# Patient Record
Sex: Male | Born: 1965 | Race: Black or African American | Hispanic: No | Smoking: Former smoker
Health system: Southern US, Community
[De-identification: ages and names within clinical notes are randomized; demographics above are authoritative.]

---

## 2003-02-20 ENCOUNTER — Encounter: Payer: Self-pay | Admitting: Emergency Medicine

## 2003-02-20 ENCOUNTER — Emergency Department (HOSPITAL_COMMUNITY): Admission: EM | Admit: 2003-02-20 | Discharge: 2003-02-20 | Payer: Self-pay | Admitting: Emergency Medicine

## 2006-01-11 ENCOUNTER — Emergency Department (HOSPITAL_COMMUNITY): Admission: EM | Admit: 2006-01-11 | Discharge: 2006-01-11 | Payer: Self-pay | Admitting: Family Medicine

## 2008-04-24 ENCOUNTER — Emergency Department (HOSPITAL_COMMUNITY): Admission: EM | Admit: 2008-04-24 | Discharge: 2008-04-24 | Payer: Self-pay | Admitting: Emergency Medicine

## 2011-08-25 LAB — POCT CARDIAC MARKERS
CKMB, poc: 1.9
Myoglobin, poc: 116
Operator id: 272551
Troponin i, poc: <0.05

## 2011-08-25 LAB — DIFFERENTIAL
Basophils Absolute: 0
Basophils Relative: 0
Eosinophils Absolute: 0
Eosinophils Relative: 0
Lymphocytes Relative: 14
Lymphs Abs: 1.2
Monocytes Absolute: 0.6
Monocytes Relative: 7
Neutro Abs: 6.5
Neutrophils Relative %: 78 — ABNORMAL HIGH

## 2011-08-25 LAB — CBC
HCT: 45.6
Hemoglobin: 15.4
MCHC: 33.9
MCV: 98.1
Platelets: 273
RBC: 4.65
RDW: 12.8
WBC: 8.4

## 2011-08-25 LAB — COMPREHENSIVE METABOLIC PANEL
ALT: 16
CO2: 25
Calcium: 9.7
GFR calc non Af Amer: >60
Glucose, Bld: 83
Sodium: 137

## 2011-08-25 LAB — RAPID URINE DRUG SCREEN, HOSP PERFORMED: Cocaine: NOT DETECTED

## 2011-08-25 LAB — POCT I-STAT, CHEM 8
Creatinine, Ser: 1.1
Glucose, Bld: 82
Hemoglobin: 17
TCO2: 25

## 2011-08-25 LAB — MAGNESIUM: Magnesium: 2.2

## 2018-01-21 ENCOUNTER — Other Ambulatory Visit: Payer: Self-pay

## 2018-01-21 ENCOUNTER — Emergency Department (HOSPITAL_COMMUNITY): Payer: BC Managed Care – PPO

## 2018-01-21 ENCOUNTER — Encounter (HOSPITAL_COMMUNITY): Payer: Self-pay

## 2018-01-21 ENCOUNTER — Encounter (HOSPITAL_COMMUNITY)
Admission: EM | Disposition: A | Discharge status: Home / Self Care | Payer: Self-pay | Source: Home / Self Care | Attending: Emergency Medicine

## 2018-01-21 ENCOUNTER — Observation Stay (HOSPITAL_COMMUNITY)
Admission: EM | Admit: 2018-01-21 | Discharge: 2018-01-22 | Disposition: A | Discharge status: Home / Self Care | Payer: BC Managed Care – PPO | Attending: General Surgery | Admitting: General Surgery

## 2018-01-21 ENCOUNTER — Emergency Department (HOSPITAL_COMMUNITY): Payer: BC Managed Care – PPO | Admitting: Anesthesiology

## 2018-01-21 DIAGNOSIS — K353 Acute appendicitis with localized peritonitis, without perforation or gangrene: Secondary | ICD-10-CM | POA: Diagnosis present

## 2018-01-21 DIAGNOSIS — I700 Atherosclerosis of aorta: Secondary | ICD-10-CM | POA: Insufficient documentation

## 2018-01-21 DIAGNOSIS — K3533 Acute appendicitis with perforation and localized peritonitis, with abscess: Secondary | ICD-10-CM | POA: Diagnosis not present

## 2018-01-21 DIAGNOSIS — I10 Essential (primary) hypertension: Secondary | ICD-10-CM | POA: Diagnosis not present

## 2018-01-21 DIAGNOSIS — Z79899 Other long term (current) drug therapy: Secondary | ICD-10-CM | POA: Diagnosis not present

## 2018-01-21 DIAGNOSIS — F17210 Nicotine dependence, cigarettes, uncomplicated: Secondary | ICD-10-CM | POA: Diagnosis not present

## 2018-01-21 HISTORY — PX: LAPAROSCOPIC APPENDECTOMY: SHX408

## 2018-01-21 LAB — COMPREHENSIVE METABOLIC PANEL
ALK PHOS: 60 U/L (ref 38–126)
ALT: 20 U/L (ref 17–63)
AST: 35 U/L (ref 15–41)
Albumin: 3.7 g/dL (ref 3.5–5.0)
Anion gap: 19 — ABNORMAL HIGH (ref 5–15)
BILIRUBIN TOTAL: 1.1 mg/dL (ref 0.3–1.2)
BUN: 11 mg/dL (ref 6–20)
CALCIUM: 8.5 mg/dL — AB (ref 8.9–10.3)
CO2: 22 mmol/L (ref 22–32)
Chloride: 94 mmol/L — ABNORMAL LOW (ref 101–111)
Creatinine, Ser: 1.14 mg/dL (ref 0.61–1.24)
Glucose, Bld: 148 mg/dL — ABNORMAL HIGH (ref 65–99)
Potassium: 3.8 mmol/L (ref 3.5–5.1)
Sodium: 135 mmol/L (ref 135–145)
TOTAL PROTEIN: 6.5 g/dL (ref 6.5–8.1)

## 2018-01-21 LAB — CBC
HCT: 46.3 % (ref 39.0–52.0)
Hemoglobin: 15.5 g/dL (ref 13.0–17.0)
MCH: 33.5 pg (ref 26.0–34.0)
MCHC: 33.5 g/dL (ref 30.0–36.0)
MCV: 100 fL (ref 78.0–100.0)
Platelets: 234 10*3/uL (ref 150–400)
RBC: 4.63 MIL/uL (ref 4.22–5.81)
RDW: 11.7 % (ref 11.5–15.5)
WBC: 14.2 10*3/uL — ABNORMAL HIGH (ref 4.0–10.5)

## 2018-01-21 LAB — URINALYSIS, ROUTINE W REFLEX MICROSCOPIC
Bacteria, UA: NONE SEEN
Bilirubin Urine: NEGATIVE
GLUCOSE, UA: NEGATIVE mg/dL
HGB URINE DIPSTICK: NEGATIVE
Ketones, ur: NEGATIVE mg/dL
Leukocytes, UA: NEGATIVE
NITRITE: NEGATIVE
Protein, ur: 30 mg/dL — AB
Specific Gravity, Urine: 1.029 (ref 1.005–1.030)
Squamous Epithelial / LPF: NONE SEEN
pH: 5 (ref 5.0–8.0)

## 2018-01-21 LAB — LIPASE, BLOOD: Lipase: 162 U/L — ABNORMAL HIGH (ref 11–51)

## 2018-01-21 SURGERY — APPENDECTOMY, LAPAROSCOPIC
Anesthesia: General | Site: Abdomen

## 2018-01-21 MED ORDER — BUPIVACAINE-EPINEPHRINE 0.25% -1:200000 IJ SOLN
INTRAMUSCULAR | Status: DC | PRN
  Administered 2018-01-21: 8 mL

## 2018-01-21 MED ORDER — ALBUTEROL SULFATE (2.5 MG/3ML) 0.083% IN NEBU
INHALATION_SOLUTION | RESPIRATORY_TRACT | Status: AC
  Filled 2018-01-21: qty 3

## 2018-01-21 MED ORDER — MORPHINE SULFATE (PF) 4 MG/ML IV SOLN
4.0000 mg | Freq: Once | INTRAVENOUS | Status: AC
  Administered 2018-01-21: 4 mg via INTRAVENOUS
  Filled 2018-01-21: qty 1

## 2018-01-21 MED ORDER — LORAZEPAM 1 MG PO TABS: 0.0000 mg | ORAL_TABLET | Freq: Two times a day (BID) | ORAL | Status: DC

## 2018-01-21 MED ORDER — THIAMINE HCL 100 MG/ML IJ SOLN: 100.0000 mg | Freq: Every day | INTRAMUSCULAR | Status: DC

## 2018-01-21 MED ORDER — ENOXAPARIN SODIUM 40 MG/0.4ML ~~LOC~~ SOLN: 40.0000 mg | SUBCUTANEOUS | Status: DC

## 2018-01-21 MED ORDER — ONDANSETRON HCL 4 MG/2ML IJ SOLN
INTRAMUSCULAR | Status: DC | PRN
  Administered 2018-01-21: 4 mg via INTRAVENOUS

## 2018-01-21 MED ORDER — IOPAMIDOL (ISOVUE-300) INJECTION 61%
INTRAVENOUS | Status: AC
  Administered 2018-01-21: 100 mL
  Filled 2018-01-21: qty 100

## 2018-01-21 MED ORDER — SODIUM CHLORIDE 0.9 % IV SOLN
2.0000 g | Freq: Once | INTRAVENOUS | Status: AC
  Administered 2018-01-21: 2 g via INTRAVENOUS
  Filled 2018-01-21: qty 20

## 2018-01-21 MED ORDER — OXYCODONE HCL 5 MG/5ML PO SOLN: 5.0000 mg | Freq: Once | ORAL | Status: DC | PRN

## 2018-01-21 MED ORDER — METRONIDAZOLE IN NACL 5-0.79 MG/ML-% IV SOLN
500.0000 mg | Freq: Once | INTRAVENOUS | Status: AC
  Administered 2018-01-21: 500 mg via INTRAVENOUS
  Filled 2018-01-21: qty 100

## 2018-01-21 MED ORDER — SODIUM CHLORIDE 0.9 % IR SOLN
Status: DC | PRN
  Administered 2018-01-21: 1000 mL

## 2018-01-21 MED ORDER — EPINEPHRINE PF 1 MG/ML IJ SOLN
INTRAMUSCULAR | Status: AC
  Filled 2018-01-21: qty 1

## 2018-01-21 MED ORDER — HYDROMORPHONE HCL 1 MG/ML IJ SOLN: 0.2500 mg | INTRAMUSCULAR | Status: DC | PRN

## 2018-01-21 MED ORDER — ALBUTEROL SULFATE (2.5 MG/3ML) 0.083% IN NEBU
2.5000 mg | INHALATION_SOLUTION | Freq: Four times a day (QID) | RESPIRATORY_TRACT | Status: DC | PRN
  Administered 2018-01-21: 2.5 mg via RESPIRATORY_TRACT

## 2018-01-21 MED ORDER — OXYCODONE HCL 5 MG PO TABS: 5.0000 mg | ORAL_TABLET | ORAL | Status: DC | PRN

## 2018-01-21 MED ORDER — ADULT MULTIVITAMIN W/MINERALS CH
1.0000 | ORAL_TABLET | Freq: Every day | ORAL | Status: DC
  Administered 2018-01-22: 1 via ORAL
  Filled 2018-01-21: qty 1

## 2018-01-21 MED ORDER — DIPHENHYDRAMINE HCL 50 MG/ML IJ SOLN: 25.0000 mg | Freq: Four times a day (QID) | INTRAMUSCULAR | Status: DC | PRN

## 2018-01-21 MED ORDER — ONDANSETRON 4 MG PO TBDP: 4.0000 mg | ORAL_TABLET | Freq: Four times a day (QID) | ORAL | Status: DC | PRN

## 2018-01-21 MED ORDER — LORAZEPAM 2 MG/ML IJ SOLN: 1.0000 mg | Freq: Four times a day (QID) | INTRAMUSCULAR | Status: DC | PRN

## 2018-01-21 MED ORDER — METOPROLOL TARTRATE 5 MG/5ML IV SOLN: 5.0000 mg | Freq: Four times a day (QID) | INTRAVENOUS | Status: DC | PRN

## 2018-01-21 MED ORDER — ONDANSETRON HCL 4 MG/2ML IJ SOLN: 4.0000 mg | Freq: Four times a day (QID) | INTRAMUSCULAR | Status: DC | PRN

## 2018-01-21 MED ORDER — NICOTINE 21 MG/24HR TD PT24
21.0000 mg | MEDICATED_PATCH | Freq: Once | TRANSDERMAL | Status: DC
  Administered 2018-01-21: 21 mg via TRANSDERMAL
  Filled 2018-01-21: qty 1

## 2018-01-21 MED ORDER — NICOTINE 14 MG/24HR TD PT24: 14.0000 mg | MEDICATED_PATCH | Freq: Every day | TRANSDERMAL | Status: DC

## 2018-01-21 MED ORDER — SODIUM CHLORIDE 0.9 % IV BOLUS (SEPSIS)
1000.0000 mL | Freq: Once | INTRAVENOUS | Status: AC
  Administered 2018-01-21: 1000 mL via INTRAVENOUS

## 2018-01-21 MED ORDER — SUGAMMADEX SODIUM 200 MG/2ML IV SOLN
INTRAVENOUS | Status: DC | PRN
  Administered 2018-01-21: 175 mg via INTRAVENOUS

## 2018-01-21 MED ORDER — SODIUM CHLORIDE 0.9 % IV SOLN
2.0000 g | INTRAVENOUS | Status: DC
  Filled 2018-01-21: qty 20

## 2018-01-21 MED ORDER — LORAZEPAM 1 MG PO TABS: 1.0000 mg | ORAL_TABLET | Freq: Four times a day (QID) | ORAL | Status: DC | PRN

## 2018-01-21 MED ORDER — METRONIDAZOLE IN NACL 5-0.79 MG/ML-% IV SOLN
500.0000 mg | Freq: Three times a day (TID) | INTRAVENOUS | Status: DC
  Administered 2018-01-22 (×2): 500 mg via INTRAVENOUS
  Filled 2018-01-21 (×3): qty 100

## 2018-01-21 MED ORDER — POLYETHYLENE GLYCOL 3350 17 G PO PACK: 17.0000 g | PACK | Freq: Every day | ORAL | Status: DC | PRN

## 2018-01-21 MED ORDER — LIDOCAINE HCL (CARDIAC) 20 MG/ML IV SOLN
INTRAVENOUS | Status: DC | PRN
  Administered 2018-01-21: 60 mg via INTRAVENOUS

## 2018-01-21 MED ORDER — PROPOFOL 10 MG/ML IV BOLUS
INTRAVENOUS | Status: DC | PRN
  Administered 2018-01-21: 200 mg via INTRAVENOUS

## 2018-01-21 MED ORDER — BUPIVACAINE HCL (PF) 0.25 % IJ SOLN
INTRAMUSCULAR | Status: AC
  Filled 2018-01-21: qty 30

## 2018-01-21 MED ORDER — MIDAZOLAM HCL 2 MG/2ML IJ SOLN
INTRAMUSCULAR | Status: AC
  Filled 2018-01-21: qty 2

## 2018-01-21 MED ORDER — VITAMIN B-1 100 MG PO TABS
100.0000 mg | ORAL_TABLET | Freq: Every day | ORAL | Status: DC
  Administered 2018-01-22: 100 mg via ORAL
  Filled 2018-01-21: qty 1

## 2018-01-21 MED ORDER — METOPROLOL TARTRATE 5 MG/5ML IV SOLN
INTRAVENOUS | Status: DC | PRN
  Administered 2018-01-21: 1 mg via INTRAVENOUS
  Administered 2018-01-21 (×2): .5 mg via INTRAVENOUS

## 2018-01-21 MED ORDER — OXYCODONE HCL 5 MG PO TABS: 5.0000 mg | ORAL_TABLET | Freq: Once | ORAL | Status: DC | PRN

## 2018-01-21 MED ORDER — 0.9 % SODIUM CHLORIDE (POUR BTL) OPTIME
TOPICAL | Status: DC | PRN
  Administered 2018-01-21: 1000 mL

## 2018-01-21 MED ORDER — AMLODIPINE BESYLATE 5 MG PO TABS
5.0000 mg | ORAL_TABLET | Freq: Every day | ORAL | Status: DC
  Administered 2018-01-22: 5 mg via ORAL
  Filled 2018-01-21: qty 1

## 2018-01-21 MED ORDER — LORAZEPAM 1 MG PO TABS: 0.0000 mg | ORAL_TABLET | Freq: Four times a day (QID) | ORAL | Status: DC

## 2018-01-21 MED ORDER — MORPHINE SULFATE (PF) 4 MG/ML IV SOLN
2.0000 mg | Freq: Once | INTRAVENOUS | Status: AC
  Administered 2018-01-21: 2 mg via INTRAVENOUS
  Filled 2018-01-21: qty 1

## 2018-01-21 MED ORDER — FENTANYL CITRATE (PF) 250 MCG/5ML IJ SOLN
INTRAMUSCULAR | Status: AC
  Filled 2018-01-21: qty 5

## 2018-01-21 MED ORDER — ACETAMINOPHEN 325 MG PO TABS
650.0000 mg | ORAL_TABLET | Freq: Four times a day (QID) | ORAL | Status: DC | PRN
  Administered 2018-01-22: 650 mg via ORAL
  Filled 2018-01-21: qty 2

## 2018-01-21 MED ORDER — DEXAMETHASONE SODIUM PHOSPHATE 10 MG/ML IJ SOLN
INTRAMUSCULAR | Status: DC | PRN
  Administered 2018-01-21: 10 mg via INTRAVENOUS

## 2018-01-21 MED ORDER — FENTANYL CITRATE (PF) 250 MCG/5ML IJ SOLN
INTRAMUSCULAR | Status: AC
Start: 2018-01-21 — End: ?
  Filled 2018-01-21: qty 5

## 2018-01-21 MED ORDER — DIPHENHYDRAMINE HCL 25 MG PO CAPS
25.0000 mg | ORAL_CAPSULE | Freq: Four times a day (QID) | ORAL | Status: DC | PRN
Start: 2018-01-21 — End: 2018-01-22

## 2018-01-21 MED ORDER — ACETAMINOPHEN 650 MG RE SUPP: 650.0000 mg | Freq: Four times a day (QID) | RECTAL | Status: DC | PRN

## 2018-01-21 MED ORDER — ROCURONIUM BROMIDE 100 MG/10ML IV SOLN
INTRAVENOUS | Status: DC | PRN
  Administered 2018-01-21: 50 mg via INTRAVENOUS

## 2018-01-21 MED ORDER — MIDAZOLAM HCL 5 MG/5ML IJ SOLN
INTRAMUSCULAR | Status: DC | PRN
  Administered 2018-01-21 (×2): 2 mg via INTRAVENOUS

## 2018-01-21 MED ORDER — SODIUM CHLORIDE 0.9 % IV SOLN
INTRAVENOUS | Status: DC
  Administered 2018-01-22: 07:00:00 via INTRAVENOUS

## 2018-01-21 MED ORDER — LACTATED RINGERS IV SOLN
INTRAVENOUS | Status: DC | PRN
  Administered 2018-01-21 (×2): via INTRAVENOUS

## 2018-01-21 MED ORDER — FOLIC ACID 1 MG PO TABS
1.0000 mg | ORAL_TABLET | Freq: Every day | ORAL | Status: DC
  Administered 2018-01-22: 1 mg via ORAL
  Filled 2018-01-21: qty 1

## 2018-01-21 MED ORDER — FENTANYL CITRATE (PF) 100 MCG/2ML IJ SOLN
INTRAMUSCULAR | Status: DC | PRN
  Administered 2018-01-21 (×5): 50 ug via INTRAVENOUS

## 2018-01-21 MED ORDER — HYDROMORPHONE HCL 1 MG/ML IJ SOLN: 1.0000 mg | INTRAMUSCULAR | Status: DC | PRN

## 2018-01-21 MED ORDER — SUCCINYLCHOLINE CHLORIDE 20 MG/ML IJ SOLN
INTRAMUSCULAR | Status: DC | PRN
  Administered 2018-01-21: 120 mg via INTRAVENOUS

## 2018-01-21 SURGICAL SUPPLY — 42 items
APPLIER CLIP ROT 10 11.4 M/L (STAPLE)
BENZOIN TINCTURE PRP APPL 2/3 (GAUZE/BANDAGES/DRESSINGS) ×3 IMPLANT
BLADE CLIPPER SURG (BLADE) ×3 IMPLANT
CANISTER SUCT 3000ML PPV (MISCELLANEOUS) ×3 IMPLANT
CHLORAPREP W/TINT 26ML (MISCELLANEOUS) ×3 IMPLANT
CLIP APPLIE ROT 10 11.4 M/L (STAPLE) IMPLANT
CLOSURE WOUND 1/2 X4 (GAUZE/BANDAGES/DRESSINGS) ×1
COVER SURGICAL LIGHT HANDLE (MISCELLANEOUS) ×3 IMPLANT
CUTTER FLEX LINEAR 45M (STAPLE) ×3 IMPLANT
DRSG TEGADERM 2-3/8X2-3/4 SM (GAUZE/BANDAGES/DRESSINGS) ×3 IMPLANT
DRSG TEGADERM 4X4.75 (GAUZE/BANDAGES/DRESSINGS) ×3 IMPLANT
ELECT REM PT RETURN 9FT ADLT (ELECTROSURGICAL) ×3
ELECTRODE REM PT RTRN 9FT ADLT (ELECTROSURGICAL) ×1 IMPLANT
ENDOLOOP SUT PDS II  0 18 (SUTURE)
ENDOLOOP SUT PDS II 0 18 (SUTURE) IMPLANT
FILTER SMOKE EVAC LAPAROSHD (FILTER) ×3 IMPLANT
GAUZE SPONGE 2X2 8PLY STRL LF (GAUZE/BANDAGES/DRESSINGS) ×1 IMPLANT
GLOVE BIO SURGEON STRL SZ7 (GLOVE) ×3 IMPLANT
GLOVE BIOGEL PI IND STRL 7.5 (GLOVE) ×1 IMPLANT
GLOVE BIOGEL PI INDICATOR 7.5 (GLOVE) ×2
GOWN STRL REUS W/ TWL LRG LVL3 (GOWN DISPOSABLE) ×2 IMPLANT
GOWN STRL REUS W/TWL LRG LVL3 (GOWN DISPOSABLE) ×4
KIT BASIN OR (CUSTOM PROCEDURE TRAY) ×3 IMPLANT
KIT ROOM TURNOVER OR (KITS) ×3 IMPLANT
NS IRRIG 1000ML POUR BTL (IV SOLUTION) ×3 IMPLANT
PAD ARMBOARD 7.5X6 YLW CONV (MISCELLANEOUS) ×6 IMPLANT
POUCH SPECIMEN RETRIEVAL 10MM (ENDOMECHANICALS) ×3 IMPLANT
RELOAD STAPLE TA45 3.5 REG BLU (ENDOMECHANICALS) ×3 IMPLANT
SCISSORS ENDO CVD 5DCS (MISCELLANEOUS) IMPLANT
SET IRRIG TUBING LAPAROSCOPIC (IRRIGATION / IRRIGATOR) ×3 IMPLANT
SHEARS HARMONIC ACE PLUS 36CM (ENDOMECHANICALS) ×3 IMPLANT
SLEEVE ENDOPATH XCEL 5M (ENDOMECHANICALS) ×3 IMPLANT
SPECIMEN JAR SMALL (MISCELLANEOUS) ×3 IMPLANT
SPONGE GAUZE 2X2 STER 10/PKG (GAUZE/BANDAGES/DRESSINGS) ×2
STRIP CLOSURE SKIN 1/2X4 (GAUZE/BANDAGES/DRESSINGS) ×2 IMPLANT
SUT MNCRL AB 4-0 PS2 18 (SUTURE) ×3 IMPLANT
TOWEL OR 17X24 6PK STRL BLUE (TOWEL DISPOSABLE) ×3 IMPLANT
TOWEL OR 17X26 10 PK STRL BLUE (TOWEL DISPOSABLE) ×3 IMPLANT
TRAY LAPAROSCOPIC MC (CUSTOM PROCEDURE TRAY) ×3 IMPLANT
TROCAR XCEL BLUNT TIP 100MML (ENDOMECHANICALS) ×3 IMPLANT
TROCAR XCEL NON-BLD 5MMX100MML (ENDOMECHANICALS) ×3 IMPLANT
TUBING INSUFFLATION (TUBING) ×3 IMPLANT

## 2018-01-21 NOTE — H&P (Signed)
Joseph Rowe is an 52 y.o. male.   Chief Complaint: RLQ abdominal pain HPI: This is a 52 yo male who presents with almost 24 hours of generalized abdominal pain now localized to RLQ.  No nausea, vomiting, or diarrhea.  Afebrile.  Has required IV pain meds.  CT scan shows appendicitis.  History reviewed. No pertinent past medical history.  History reviewed. No pertinent surgical history.  No family history on file. Social History:  reports that he has been smoking cigarettes.  He has been smoking about 2.00 packs per day. he has never used smokeless tobacco. He reports that he drinks about 21.0 oz of alcohol per week. He reports that he does not use drugs.  Allergies: No Known Allergies  Prior to Admission medications   Medication Sig Start Date End Date Taking? Authorizing Provider  amLODipine (NORVASC) 5 MG tablet Take 5 mg by mouth daily.   Yes [provider]  Aspirin-Caffeine (BC FAST PAIN RELIEF PO) Take 1 packet by mouth daily as needed (pain).   Yes [provider]  sodium-potassium bicarbonate (ALKA-SELTZER GOLD) TBEF dissolvable tablet Take 2 tablets by mouth daily as needed (stomach).   Yes [provider]     Results for orders placed or performed during the hospital encounter of 01/21/18 (from the past 48 hour(s))  Lipase, blood     Status: Abnormal   Collection Time: 01/21/18 12:40 PM  Result Value Ref Range   Lipase 162 (H) 11 - 51 U/L    Comment: Performed at [facility], [place] [address], [place], [place] [address]  Comprehensive metabolic panel     Status: Abnormal   Collection Time: 01/21/18 12:40 PM  Result Value Ref Range   Sodium 135 135 - 145 mmol/L   Potassium 3.8 3.5 - 5.1 mmol/L   Chloride 94 (L) 101 - 111 mmol/L   CO2 22 22 - 32 mmol/L   Glucose, Bld 148 (H) 65 - 99 mg/dL   BUN 11 6 - 20 mg/dL   Creatinine, Ser 1.14 0.61 - 1.24 mg/dL   Calcium 8.5 (L) 8.9 - 10.3 mg/dL   Total Protein 6.5 6.5 - 8.1 g/dL   Albumin 3.7  3.5 - 5.0 g/dL   AST 35 15 - 41 U/L   ALT 20 17 - 63 U/L   Alkaline Phosphatase 60 38 - 126 U/L   Total Bilirubin 1.1 0.3 - 1.2 mg/dL   GFR calc non Af Amer >60 >60 mL/min   GFR calc Af Amer >60 >60 mL/min    Comment: (NOTE) The eGFR has been calculated using the CKD EPI equation. This calculation has not been validated in all clinical situations. eGFR's persistently <60 mL/min signify possible Chronic Kidney Disease.    Anion gap 19 (H) 5 - 15    Comment: Performed at [facility], [place] [address] [place], [place] [address]  CBC     Status: Abnormal   Collection Time: 01/21/18 12:40 PM  Result Value Ref Range   WBC 14.2 (H) 4.0 - 10.5 K/uL   RBC 4.63 4.22 - 5.81 MIL/uL   Hemoglobin 15.5 13.0 - 17.0 g/dL   HCT 46.3 39.0 - 52.0 %   MCV 100.0 78.0 - 100.0 fL   MCH 33.5 26.0 - 34.0 pg   MCHC 33.5 30.0 - 36.0 g/dL   RDW 11.7 11.5 - 15.5 %   Platelets 234 150 - 400 K/uL    Comment: Performed at [facility],  [address] [place], [place] [address]  Urinalysis, Routine w reflex microscopic     Status: Abnormal   Collection Time: 01/21/18 12:56 PM  Result Value Ref Range   Color, Urine AMBER (A) YELLOW    Comment: BIOCHEMICALS MAY BE AFFECTED BY COLOR   APPearance HAZY (A) CLEAR   Specific Gravity, Urine 1.029 1.005 - 1.030   pH 5.0 5.0 - 8.0   Glucose, UA NEGATIVE NEGATIVE mg/dL   Hgb urine dipstick NEGATIVE NEGATIVE   Bilirubin Urine NEGATIVE NEGATIVE   Ketones, ur NEGATIVE NEGATIVE mg/dL   Protein, ur 30 (A) NEGATIVE mg/dL   Nitrite NEGATIVE NEGATIVE   Leukocytes, UA NEGATIVE NEGATIVE   RBC / HPF 0-5 0 - 5 RBC/hpf   WBC, UA 0-5 0 - 5 WBC/hpf   Bacteria, UA NONE SEEN NONE SEEN   Squamous Epithelial / LPF NONE SEEN NONE SEEN   Mucus PRESENT     Comment: Performed at [facility], [place] [address] [place], [place] [address]   Ct Abdomen Pelvis W Contrast  Result Date: 01/21/2018 CLINICAL DATA:  Abdominal pain beginning last night. Clinical suspicion  for appendicitis. EXAM: CT ABDOMEN AND PELVIS WITH CONTRAST TECHNIQUE: Multidetector CT imaging of the abdomen and pelvis was performed using the standard protocol following bolus administration of intravenous contrast. CONTRAST:  12m ISOVUE-300 IOPAMIDOL (ISOVUE-300) INJECTION 61% COMPARISON:  None. FINDINGS: Lower Chest: No acute findings. Hepatobiliary: No hepatic masses identified. Tiny gallstones noted, however there is no evidence of cholecystitis or biliary ductal dilatation. Pancreas:  No mass or inflammatory changes. Spleen: Within normal limits in size and appearance. Adrenals/Urinary Tract: No masses identified. No evidence of hydronephrosis. Stomach/Bowel:  Findings of acute appendicitis as follows: Appendix: Location: Standard Diameter: 10 mm Appendicolith: Absent Mucosal hyper-enhancement: Present Extraluminal Gas: Absent Periappendiceal Collection: None Vascular/Lymphatic: No pathologically enlarged lymph nodes. No abdominal aortic aneurysm. Aortic atherosclerosis. Reproductive:  No mass or other significant abnormality. Other:  None. Musculoskeletal:  No suspicious bone lesions identified. IMPRESSION: Positive for acute appendicitis. No evidence of abscess or other complication. Cholelithiasis.  No radiographic evidence of cholecystitis. Electronically Signed   By: JEarle GellM.D.   On: 01/21/2018 18:14    Review of Systems  Constitutional: Negative for weight loss.  HENT: Negative for ear discharge, ear pain, hearing loss and tinnitus.   Eyes: Negative for blurred vision, double vision, photophobia and pain.  Respiratory: Negative for cough, sputum production and shortness of breath.   Cardiovascular: Negative for chest pain.  Gastrointestinal: Positive for abdominal pain. Negative for nausea and vomiting.  Genitourinary: Negative for dysuria, flank pain, frequency and urgency.  Musculoskeletal: Negative for back pain, falls, joint pain, myalgias and neck pain.  Neurological: Negative  for dizziness, tingling, sensory change, focal weakness, loss of consciousness and headaches.  Endo/Heme/Allergies: Does not bruise/bleed easily.  Psychiatric/Behavioral: Negative for depression, memory loss and substance abuse. The patient is not nervous/anxious.     Blood pressure 115/76, pulse 99, temperature 98.4 F (36.9 C), temperature source Oral, resp. rate 16, height _0  (1.753 m), weight 81.6 kg (180 lb), SpO2 96 %. Physical Exam  WDWN in NAD Eyes:  Pupils equal, round; sclera anicteric HENT:  Oral mucosa moist; good dentition  Neck:  No masses palpated, no thyromegaly Lungs:  CTA bilaterally; normal respiratory effort CV:  Regular rate and rhythm; no murmurs; extremities well-perfused with no edema Abd:  +bowel sounds, soft, tender in RLQ at McBurney's point; voluntary guarding; no rebound Skin:  Warm, dry;  no sign of jaundice Psychiatric - alert and oriented x 4; calm mood and affect  Assessment/Plan Acute appendicitis - no sign of perforation or abscess  To OR for laparoscopic appendectomy.  The surgical procedure has been discussed with the patient.  Potential risks, benefits, alternative treatments, and expected outcomes have been explained.  All of the patient's questions at this time have been answered.  The likelihood of reaching the patient's treatment goal is good.  The patient understand the proposed surgical procedure and wishes to proceed.   Maia Petties, MD 01/21/2018, 7:19 PM

## 2018-01-21 NOTE — ED Triage Notes (Signed)
Per Pt, Pt is coming from home with complaints of abdominal pain that started last night after he ate dinner. Denies any N/V/D, but complaints of the pain being in his lower right side. Increased urination.

## 2018-01-21 NOTE — Op Note (Signed)
Appendectomy, Lap, Procedure Note  Indications: The patient presented with a history of right-sided abdominal pain for 24 hours. A CT scan revealed findings consistent with acute appendicitis.  Pre-operative Diagnosis: Acute appendicitis without mention of peritonitis  Post-operative Diagnosis: Perforated appendicitis  Surgeon: Wynona Luna   Assistants: none  Anesthesia: General endotracheal anesthesia  ASA Class: 2E  Procedure Details  The patient was seen again in the Holding Room. The risks, benefits, complications, treatment options, and expected outcomes were discussed with the patient and/or family. The possibilities of reaction to medication, perforation of viscus, bleeding, recurrent infection, finding a normal appendix, the need for additional procedures, failure to diagnose a condition, and creating a complication requiring transfusion or operation were discussed. There was concurrence with the proposed plan and informed consent was obtained. The site of surgery was properly noted. The patient was taken to Operating Room, identified as Joseph Rowe and the procedure verified as Appendectomy. A Time Out was held and the above information confirmed.  The patient was placed in the supine position and general anesthesia was induced.  The abdomen was prepped and draped in a sterile fashion. A one centimeter supraumbilical incision was made.  Dissection was carried down to the fascia bluntly.  The fascia was incised vertically.  We entered the peritoneal cavity bluntly.  A pursestring suture was passed around the incision with a 0 Vicryl.  The Hasson cannula was introduced into the abdomen and the tails of the suture were used to hold the Hasson in place.   The pneumoperitoneum was then established maintaining a maximum pressure of 15 mmHg.  Additional 5 mm cannulas then placed in the left lower quadrant of the abdomen and the right upper quadrant under direct visualization. A  careful evaluation of the entire abdomen was carried out. The patient was placed in Trendelenburg and left lateral decubitus position.  The peritoneum on the right side of the abdomen is quite inflamed. The scope was moved to the right upper quadrant port site. The cecum was mobilized medially.  The appendix is quite inflamed all the way down to the base and adherent down in the pelvis.  The tissue is quite friable.  The appendix appears to be necrotic in the midportion. The appendix was carefully dissected. The appendix was was skeletonized with the harmonic scalpel.   The appendix was divided at its base using an endo-GIA stapler. No appendiceal stump was left in place. There was a small bleeding vessel at the mesoappendix that was identified and controlled with the harmonic scalpel. Irrigation was also performed and irrigate suctioned from the abdomen as well.  The umbilical port site was closed with the purse string suture. There was no residual palpable fascial defect.  The trocar site skin wounds were closed with 4-0 Monocryl.  Instrument, sponge, and needle counts were correct at the conclusion of the case.   Findings: The appendix was found to be inflamed. There were signs of necrosis.  There was perforation. There was not abscess formation.  Estimated Blood Loss:  less than 50 mL         Drains: none         Specimens: appendix         Complications:  None; patient tolerated the procedure well.         Disposition: PACU - hemodynamically stable.         Condition: stable  Wilmon Arms. Corliss Skains, MD, [facility]  General/ Trauma Surgery  01/21/2018  9:29 PM

## 2018-01-21 NOTE — ED Notes (Signed)
Pt undressed and belongings placed in pt belongings bags.

## 2018-01-21 NOTE — Transfer of Care (Signed)
Immediate Anesthesia Transfer of Care Note  Patient: Joseph Rowe  Procedure(s) Performed: APPENDECTOMY LAPAROSCOPIC (N/A Abdomen)  Patient Location: PACU  Anesthesia Type:General  Level of Consciousness: drowsy  Airway & Oxygen Therapy: Patient Spontanous Breathing and Patient connected to nasal cannula oxygen  Post-op Assessment: Report given to RN and Post -op Vital signs reviewed and stable  Post vital signs: Reviewed and stable  Last Vitals:  Vitals:   01/21/18 1908 01/21/18 2134  BP: 115/76 (P) 119/77  Pulse: 99 (!) (P) 114  Resp: 16 (!) (P) 25  Temp:  (P) 36.7 C  SpO2: 96% (P) 92%    Last Pain:  Vitals:   01/21/18 1920  TempSrc:   PainSc: 3          Complications: No apparent anesthesia complications

## 2018-01-21 NOTE — Anesthesia Preprocedure Evaluation (Addendum)
Anesthesia Evaluation  Patient identified by MRN, date of birth, ID band Patient awake    Reviewed: Allergy & Precautions, H&P , NPO status , Patient's Chart, lab work & pertinent test results  Airway Mallampati: II   Neck ROM: full    Dental  (+) Teeth Intact,    Pulmonary Current Smoker,    breath sounds clear to auscultation       Cardiovascular hypertension,  Rhythm:regular Rate:Normal     Neuro/Psych    GI/Hepatic (+)     substance abuse  alcohol use,   Endo/Other    Renal/GU      Musculoskeletal   Abdominal   Peds  Hematology   Anesthesia Other Findings   Reproductive/Obstetrics                           Anesthesia Physical Anesthesia Plan  ASA: II  Anesthesia Plan: General   Post-op Pain Management:    Induction: Intravenous  PONV Risk Score and Plan: 1 and Ondansetron, Dexamethasone, Midazolam and Treatment may vary due to age or medical condition  Airway Management Planned: Oral ETT  Additional Equipment:   Intra-op Plan:   Post-operative Plan: Extubation in OR  Informed Consent: I have reviewed the patients History and Physical, chart, labs and discussed the procedure including the risks, benefits and alternatives for the proposed anesthesia with the patient or authorized representative who has indicated his/her understanding and acceptance.     Plan Discussed with: CRNA, Anesthesiologist and Surgeon  Anesthesia Plan Comments:         Anesthesia Quick Evaluation

## 2018-01-21 NOTE — ED Notes (Signed)
Dr. Corliss Skainssuei spoke w/ pt about surgery. Pt signed consent form.

## 2018-01-21 NOTE — ED Provider Notes (Signed)
[facility] Provider Note   CSN: [id number] Arrival date & time: 01/21/18  1223     History   Chief Complaint Chief Complaint  Patient presents with  . Abdominal Pain    HPI Joseph Rowe is a 52 y.o. male with past medical history of hypertension, presenting to the ED with acute onset of abdominal pain that began last night.  Patient states after he ate dinner began having generalized crampy abdominal pain, however the pain has migrated to the right lower quadrant has been constant and sharp since then.  Pain is worse with movement.  States he took a BC powder overnight for pain.  He denies nausea, vomiting, diarrhea, constipation, urinary symptoms, fever or chills.  No history of abdominal surgeries. Pt does states he drinks daily, however did not drink as much yesterday.   The history is provided by the patient.    History reviewed. No pertinent past medical history.  There are no active problems to display for this patient.   History reviewed. No pertinent surgical history.     Home Medications    Prior to Admission medications   Medication Sig Start Date End Date Taking? Authorizing Provider  amLODipine (NORVASC) 5 MG tablet Take 5 mg by mouth daily.   Yes [provider]  Aspirin-Caffeine (BC FAST PAIN RELIEF PO) Take 1 packet by mouth daily as needed (pain).   Yes [provider]  sodium-potassium bicarbonate (ALKA-SELTZER GOLD) TBEF dissolvable tablet Take 2 tablets by mouth daily as needed (stomach).   Yes [provider]    Family History No family history on file.  Social History Social History   Tobacco Use  . Smoking status: Current Every Day Smoker    Packs/day: 2.00    Types: Cigarettes  . Smokeless tobacco: Never Used  Substance Use Topics  . Alcohol use: Yes    Alcohol/week: 21.0 oz    Types: 35 Shots of liquor per week  . Drug use: No     Allergies   Patient has no  known allergies.   Review of Systems Review of Systems  Constitutional: Negative for chills and fever.  Gastrointestinal: Positive for abdominal pain. Negative for constipation, diarrhea, nausea and vomiting.  Genitourinary: Negative for dysuria and frequency.  All other systems reviewed and are negative.    Physical Exam Updated Vital Signs BP 135/85 (BP Location: Right Arm)   Pulse (!) 103   Temp 98.4 F (36.9 C) (Oral)   Resp 16   Ht 5\' 9"  (1.753 m)   Wt 81.6 kg (180 lb)   SpO2 96%   BMI 26.58 kg/m   Physical Exam  Constitutional: He appears well-developed and well-nourished. No distress.  HENT:  Head: Normocephalic and atraumatic.  Mouth/Throat: Oropharynx is clear and moist.  Eyes: Conjunctivae are normal.  Cardiovascular: Normal rate, regular rhythm, normal heart sounds and intact distal pulses.  Pulmonary/Chest: Effort normal.  Abdominal: Soft. Normal appearance and bowel sounds are normal. He exhibits no distension and no mass. There is tenderness in the right lower quadrant. There is guarding. There is no rigidity. No hernia.  Neurological: He is alert.  Skin: Skin is warm.  Psychiatric: He has a normal mood and affect. His behavior is normal.  Nursing note and vitals reviewed.    ED Treatments / Results  Labs (all labs ordered are listed, but only abnormal results are displayed) Labs Reviewed  LIPASE, BLOOD - Abnormal; Notable for the following components:  Result Value   Lipase 162 (*)    All other components within normal limits  COMPREHENSIVE METABOLIC PANEL - Abnormal; Notable for the following components:   Chloride 94 (*)    Glucose, Bld 148 (*)    Calcium 8.5 (*)    Anion gap 19 (*)    All other components within normal limits  CBC - Abnormal; Notable for the following components:   WBC 14.2 (*)    All other components within normal limits  URINALYSIS, ROUTINE W REFLEX MICROSCOPIC - Abnormal; Notable for the following components:    Color, Urine AMBER (*)    APPearance HAZY (*)    Protein, ur 30 (*)    All other components within normal limits    EKG  EKG Interpretation None       Radiology Ct Abdomen Pelvis W Contrast  Result Date: 01/21/2018 CLINICAL DATA:  Abdominal pain beginning last night. Clinical suspicion for appendicitis. EXAM: CT ABDOMEN AND PELVIS WITH CONTRAST TECHNIQUE: Multidetector CT imaging of the abdomen and pelvis was performed using the standard protocol following bolus administration of intravenous contrast. CONTRAST:  ISOVUE-300 IOPAMIDOL (ISOVUE-300) INJECTION 61% COMPARISON:  None. FINDINGS: Lower Chest: No acute findings. Hepatobiliary: No hepatic masses identified. Tiny gallstones noted, however there is no evidence of cholecystitis or biliary ductal dilatation. Pancreas:  No mass or inflammatory changes. Spleen: Within normal limits in size and appearance. Adrenals/Urinary Tract: No masses identified. No evidence of hydronephrosis. Stomach/Bowel:  Findings of acute appendicitis as follows: Appendix: Location: Standard Diameter: 10 mm Appendicolith: Absent Mucosal hyper-enhancement: Present Extraluminal Gas: Absent Periappendiceal Collection: None Vascular/Lymphatic: No pathologically enlarged lymph nodes. No abdominal aortic aneurysm. Aortic atherosclerosis. Reproductive:  No mass or other significant abnormality. Other:  None. Musculoskeletal:  No suspicious bone lesions identified. IMPRESSION: Positive for acute appendicitis. No evidence of abscess or other complication. Cholelithiasis.  No radiographic evidence of cholecystitis. Electronically Signed   By: Myles Rosenthal M.D.   On: 01/21/2018 18:14    Procedures Procedures (including critical care time)  Medications Ordered in ED Medications  cefTRIAXone (ROCEPHIN) 2 g in sodium chloride 0.9 % 100 mL IVPB (2 g Intravenous New Bag/Given 01/21/18 1842)    And  metroNIDAZOLE (FLAGYL) IVPB 500 mg (500 mg Intravenous New Bag/Given 01/21/18  1846)  nicotine (NICODERM CQ - dosed in mg/24 hours) patch 21 mg (21 mg Transdermal Patch Applied 01/21/18 1836)  morphine 4 MG/ML injection 2 mg (2 mg Intravenous Given 01/21/18 1605)  iopamidol (ISOVUE-300) 61 % injection (100 mLs  Contrast Given 01/21/18 1617)  sodium chloride 0.9 % bolus 1,000 mL (1,000 mLs Intravenous New Bag/Given 01/21/18 1842)  morphine 4 MG/ML injection 4 mg (4 mg Intravenous Given 01/21/18 1838)     Initial Impression / Assessment and Plan / ED Course  I have reviewed the triage vital signs and the nursing notes.  Pertinent labs & imaging results that were available during my care of the patient were reviewed by me and considered in my medical decision making (see chart for details).  Clinical Course as of Jan 21 1850  Sat Jan 21, 2018  1824 Acute appendicitis. Results discussed with patient. Abx ordered. Consult placed to Gen surgery CT Abdomen Pelvis W Contrast [JR]  1848 Discussed pt w Dr. Corliss Skains with general surgery, who will see patient.  [JR]    Clinical Course User Index [JR] Juwana Thoreson, [place] N, PA-C    Pt presenting with RLQ abdominal pain. Afebrile, nontoxic-appearing. Abdomen is soft with RLQ tenderness and some  guarding. Labs with mild leukocytosis. Lipase elevated, though pt endorses drinking alcohol daily; no epigastric tenderness. U/A neg. Will treat pain and obtain CT to rule out appendicitis vs other acute abdominal pathology.   CT abd positive for acute appendicitis. Results and plan discussed with patient. ABx ordered. IVF. Pain with slight improved. Will give another dose, and consult surgery. Dr. Corliss Skains with general surgery to see patient and admit.  The patient appears reasonably stabilized for admission considering the current resources, flow, and capabilities available in the ED at this time, and I doubt any other [facility] requiring further screening and/or treatment in the ED prior to admission.  Final Clinical Impressions(s) / ED Diagnoses    Final diagnoses:  Acute appendicitis with localized peritonitis    ED Discharge Orders    None       Lurie Mullane, [place] N, PA-C 01/21/18 1948    Maia Plan, MD 01/22/18 1001

## 2018-01-21 NOTE — ED Notes (Signed)
ED Provider at bedside. 

## 2018-01-21 NOTE — ED Notes (Signed)
Patient transported to CT 

## 2018-01-21 NOTE — Anesthesia Procedure Notes (Signed)
Procedure Name: Intubation Date/Time: 01/21/2018 8:22 PM Performed by: Edmonia CaprioAuston, Leondra Cullin M, CRNA Pre-anesthesia Checklist: Patient identified, Emergency Drugs available, Suction available, Patient being monitored and Timeout performed Patient Re-evaluated:Patient Re-evaluated prior to induction Oxygen Delivery Method: Circle system utilized Preoxygenation: Pre-oxygenation with 100% oxygen Induction Type: IV induction and Rapid sequence Laryngoscope Size: Miller and 2 Grade View: Grade I Tube type: Oral Tube size: 7.5 mm Number of attempts: 1 Airway Equipment and Method: Stylet Placement Confirmation: ETT inserted through vocal cords under direct vision,  positive ETCO2 and breath sounds checked- equal and bilateral Secured at: 22 cm Tube secured with: Tape Dental Injury: Teeth and Oropharynx as per pre-operative assessment  Comments: Care taken to avoid upper right front chipped incisor.  Teeth/lips unchanged after intubation.

## 2018-01-22 ENCOUNTER — Encounter (HOSPITAL_COMMUNITY): Payer: Self-pay | Admitting: *Deleted

## 2018-01-22 LAB — CBC
HCT: 42 % (ref 39.0–52.0)
HEMOGLOBIN: 13.5 g/dL (ref 13.0–17.0)
MCH: 32.6 pg (ref 26.0–34.0)
MCHC: 32.1 g/dL (ref 30.0–36.0)
MCV: 101.4 fL — AB (ref 78.0–100.0)
Platelets: 240 10*3/uL (ref 150–400)
RBC: 4.14 MIL/uL — AB (ref 4.22–5.81)
RDW: 12 % (ref 11.5–15.5)
WBC: 15.3 10*3/uL — ABNORMAL HIGH (ref 4.0–10.5)

## 2018-01-22 LAB — COMPREHENSIVE METABOLIC PANEL
ALBUMIN: 3.1 g/dL — AB (ref 3.5–5.0)
ALT: 16 U/L — ABNORMAL LOW (ref 17–63)
ANION GAP: 10 (ref 5–15)
AST: 22 U/L (ref 15–41)
Alkaline Phosphatase: 48 U/L (ref 38–126)
BUN: 10 mg/dL (ref 6–20)
CHLORIDE: 104 mmol/L (ref 101–111)
CO2: 23 mmol/L (ref 22–32)
CREATININE: 1 mg/dL (ref 0.61–1.24)
Calcium: 8.9 mg/dL (ref 8.9–10.3)
GFR calc non Af Amer: >60 mL/min (ref >60)
GLUCOSE: 131 mg/dL — AB (ref 65–99)
Potassium: 4.1 mmol/L (ref 3.5–5.1)
SODIUM: 137 mmol/L (ref 135–145)
Total Bilirubin: 0.9 mg/dL (ref 0.3–1.2)
Total Protein: 6.2 g/dL — ABNORMAL LOW (ref 6.5–8.1)

## 2018-01-22 MED ORDER — OXYCODONE HCL 5 MG PO TABS: 5.0000 mg | ORAL_TABLET | Freq: Four times a day (QID) | ORAL | 0 refills | Status: DC | PRN

## 2018-01-22 MED ORDER — AMOXICILLIN-POT CLAVULANATE 875-125 MG PO TABS: 1.0000 | ORAL_TABLET | Freq: Two times a day (BID) | ORAL | 0 refills | Status: AC

## 2018-01-22 NOTE — Progress Notes (Signed)
Pt doing well post surgical lap appe.  Using IS, urinating, will go home after lunch, giving last IV antibiotic now.

## 2018-01-22 NOTE — Discharge Instructions (Signed)
Please arrive at least 30 min before your appointment to complete your check in paperwork.  If you are unable to arrive 30 min prior to your appointment time we may have to cancel or reschedule you. ° °LAPAROSCOPIC SURGERY: POST OP INSTRUCTIONS  °1. DIET: Follow a light bland diet the first 24 hours after arrival home, such as soup, liquids, crackers, etc. Be sure to include lots of fluids daily. Avoid fast food or heavy meals as your are more likely to get nauseated. Eat a low fat the next few days after surgery.  °2. Take your usually prescribed home medications unless otherwise directed. °3. PAIN CONTROL:  °1. Pain is best controlled by a usual combination of three different methods TOGETHER:  °1. Ice/Heat °2. Over the counter pain medication °3. Prescription pain medication °2. Most patients will experience some swelling and bruising around the incisions. Ice packs or heating pads (30-60 minutes up to 6 times a day) will help. Use ice for the first few days to help decrease swelling and bruising, then switch to heat to help relax tight/sore spots and speed recovery. Some people prefer to use ice alone, heat alone, alternating between ice & heat. Experiment to what works for you. Swelling and bruising can take several weeks to resolve.  °3. It is helpful to take an over-the-counter pain medication regularly for the first few weeks. Choose one of the following that works best for you:  °1. Naproxen (Aleve, etc) Two 220mg tabs twice a day °2. Ibuprofen (Advil, etc) Three 200mg tabs four times a day (every meal & bedtime) °3. Acetaminophen (Tylenol, etc) 500-650mg four times a day (every meal & bedtime) °4. A prescription for pain medication (such as oxycodone, hydrocodone, etc) should be given to you upon discharge. Take your pain medication as prescribed.  °1. If you are having problems/concerns with the prescription medicine (does not control pain, nausea, vomiting, rash, itching, etc), please call us [phone] to see if we need to switch you to a different pain medicine that will work better for you and/or control your side effect better. °2. If you need a refill on your pain medication, please contact your pharmacy. They will contact our office to request authorization. Prescriptions will not be filled after 5 pm or on week-ends. °4. Avoid getting constipated. Between the surgery and the pain medications, it is common to experience some constipation. Increasing fluid intake and taking a fiber supplement (such as Metamucil, Citrucel, FiberCon, MiraLax, etc) 1-2 times a day regularly will usually help prevent this problem from occurring. A mild laxative (prune juice, Milk of Magnesia, MiraLax, etc) should be taken according to package directions if there are no bowel movements after 48 hours.  °5. Watch out for diarrhea. If you have many loose bowel movements, simplify your diet to bland foods & liquids for a few days. Stop any stool softeners and decrease your fiber supplement. Switching to mild anti-diarrheal medications (Kayopectate, Pepto Bismol) can help. If this worsens or does not improve, please call us. °6. Wash / shower every day. You may shower over the dressings as they are waterproof. Continue to shower over incision(s) after the dressing is off. °7. Remove your waterproof bandages 5 days after surgery. You may leave the incision open to air. You may replace a dressing/Band-Aid to cover the incision for comfort if you wish.  °8. ACTIVITIES as tolerated:  °1. You may resume regular (light) daily activities beginning the next day--such as daily self-care, walking, climbing stairs--gradually   increasing activities as tolerated. If you can walk 30 minutes without difficulty, it is safe to try more intense activity such as jogging, treadmill, bicycling, low-impact aerobics, swimming, etc. °2. Save the most intensive and strenuous activity for last such as sit-ups, heavy lifting, contact sports, etc Refrain  from any heavy lifting or straining until you are off narcotics for pain control.  °3. DO NOT PUSH THROUGH PAIN. Let pain be your guide: If it hurts to do something, don't do it. Pain is your body warning you to avoid that activity for another week until the pain goes down. °4. You may drive when you are no longer taking prescription pain medication, you can comfortably wear a seatbelt, and you can safely maneuver your car and apply brakes. °5. You may have sexual intercourse when it is comfortable.  °9. FOLLOW UP in our office  °1. Please call [facility] at [phone] to set up an appointment to see your surgeon in the office for a follow-up appointment approximately 2-3 weeks after your surgery. °2. Make sure that you call for this appointment the day you arrive home to insure a convenient appointment time. °     10. IF YOU HAVE DISABILITY OR FAMILY LEAVE FORMS, BRING THEM TO THE               OFFICE FOR PROCESSING.  ° °WHEN TO CALL US [phone]:  °1. Poor pain control °2. Reactions / problems with new medications (rash/itching, nausea, etc)  °3. Fever over 101.5 F (38.5 C) °4. Inability to urinate °5. Nausea and/or vomiting °6. Worsening swelling or bruising °7. Continued bleeding from incision. °8. Increased pain, redness, or drainage from the incision ° °The clinic staff is available to answer your questions during regular business hours (8:30am-5pm). Please don’t hesitate to call and ask to speak to one of our nurses for clinical concerns.  °If you have a medical emergency, go to the nearest emergency room or call 911.  °A surgeon from [facility] is always on call at the hospitals  ° °[facility]  °[address], [address], [place], [place] [address] ?  °MAIN: [phone] ? TOLL FREE: [phone] ?  °FAX [phone]  °[email or web address] ° °

## 2018-01-22 NOTE — Discharge Summary (Signed)
[facility] Discharge Summary   Patient ID: Joseph Rowe MRN: [id number] DOB/AGE: 1966/03/06 52 y.o.  Admit date: 01/21/2018 Discharge date: 01/22/2018  Admitting Diagnosis: Acute appendicitis  Discharge Diagnosis Patient Active Problem List   Diagnosis Date Noted  . Acute appendicitis with localized peritonitis 01/21/2018    Consultants None  Imaging: Ct Abdomen Pelvis W Contrast  Result Date: 01/21/2018 CLINICAL DATA:  Abdominal pain beginning last night. Clinical suspicion for appendicitis. EXAM: CT ABDOMEN AND PELVIS WITH CONTRAST TECHNIQUE: Multidetector CT imaging of the abdomen and pelvis was performed using the standard protocol following bolus administration of intravenous contrast. CONTRAST:  ISOVUE-300 IOPAMIDOL (ISOVUE-300) INJECTION 61% COMPARISON:  None. FINDINGS: Lower Chest: No acute findings. Hepatobiliary: No hepatic masses identified. Tiny gallstones noted, however there is no evidence of cholecystitis or biliary ductal dilatation. Pancreas:  No mass or inflammatory changes. Spleen: Within normal limits in size and appearance. Adrenals/Urinary Tract: No masses identified. No evidence of hydronephrosis. Stomach/Bowel:  Findings of acute appendicitis as follows: Appendix: Location: Standard Diameter: 10 mm Appendicolith: Absent Mucosal hyper-enhancement: Present Extraluminal Gas: Absent Periappendiceal Collection: None Vascular/Lymphatic: No pathologically enlarged lymph nodes. No abdominal aortic aneurysm. Aortic atherosclerosis. Reproductive:  No mass or other significant abnormality. Other:  None. Musculoskeletal:  No suspicious bone lesions identified. IMPRESSION: Positive for acute appendicitis. No evidence of abscess or other complication. Cholelithiasis.  No radiographic evidence of cholecystitis. Electronically Signed   By: Myles Rosenthal M.D.   On: 01/21/2018 18:14    Procedures Dr. Corliss Skains (01/21/18) - Laparoscopic Appendectomy  Hospital Course:   Patient is a 52 year old male who presented to [facility] with abdominal pain.  Workup showed acute appendicitis.  Patient was admitted and underwent procedure listed above.  Tolerated procedure well and was transferred to the floor.  Diet was advanced as tolerated.  On POD#1, the patient was voiding well, tolerating diet, ambulating well, pain well controlled, vital signs stable, incisions c/d/i and felt stable for discharge home.  Patient will follow up in our office in 2 weeks and knows to call with questions or concerns.  He will call to confirm appointment date/time.    Physical Exam: General:  Alert, NAD, pleasant, comfortable Abd:  Soft, ND, mild tenderness, incisions C/D/I, +BS  I have personally looked this patient up in the [place] Controlled Substance Database and reviewed their medications.  Allergies as of 01/22/2018   No Known Allergies     Medication List    TAKE these medications   amLODipine 5 MG tablet Commonly known as:  NORVASC Take 5 mg by mouth daily.   amoxicillin-clavulanate 875-125 MG tablet Commonly known as:  AUGMENTIN Take 1 tablet by mouth every 12 (twelve) hours for 5 days.   BC FAST PAIN RELIEF PO Take 1 packet by mouth daily as needed (pain).   oxyCODONE 5 MG immediate release tablet Commonly known as:  Oxy IR/ROXICODONE Take 1-2 tablets (5-10 mg total) by mouth every 6 (six) hours as needed for moderate pain.   sodium-potassium bicarbonate Tbef dissolvable tablet Commonly known as:  ALKA-SELTZER GOLD Take 2 tablets by mouth daily as needed (stomach).        Follow-up Information    [facility] Call.   Specialty:  General Surgery Why:  Call to confirm appointment date/time. Please arrive 30 min prior to appointment time. Bring photo ID and insurance information.  Contact information: [address] [place] [address] [phone]  Signed: Wells GuilesKelly Rayburn, [facility] 01/22/2018, 8:04  AM Pager: [phone] Consults: [phone] Mon-Fri 7:00 am-4:30 pm Sat-Sun 7:00 am-11:30 am

## 2018-01-23 ENCOUNTER — Encounter (HOSPITAL_COMMUNITY): Payer: Self-pay | Admitting: Surgery

## 2018-01-23 NOTE — Anesthesia Postprocedure Evaluation (Signed)
Anesthesia Post Note  Patient: Joseph Rowe  Procedure(s) Performed: APPENDECTOMY LAPAROSCOPIC (N/A Abdomen)     Patient location during evaluation: PACU Anesthesia Type: General Level of consciousness: awake and alert Pain management: pain level controlled Vital Signs Assessment: post-procedure vital signs reviewed and stable Respiratory status: spontaneous breathing, nonlabored ventilation, respiratory function stable and patient connected to nasal cannula oxygen Cardiovascular status: blood pressure returned to baseline and stable Postop Assessment: no apparent nausea or vomiting Anesthetic complications: no    Last Vitals:  Vitals:   01/22/18 0450 01/22/18 0953  BP: 119/76 131/70  Pulse: 93 94  Resp: 17 14  Temp: 36.9 C 36.8 C  SpO2: 98% 95%    Last Pain:  Vitals:   01/22/18 0953  TempSrc: Oral  PainSc:                  Catheryne Deford S

## 2018-10-05 IMAGING — CT CT ABD-PELV W/ CM
2 of 5 series · 17 of 46 positions shown, 19 images · IV contrast (APPLIED)
Comparison: None.

CLINICAL DATA: Abdominal pain beginning last night. Clinical
suspicion for appendicitis.

EXAM:
CT ABDOMEN AND PELVIS WITH CONTRAST
TECHNIQUE: Multidetector CT imaging of the abdomen and pelvis was performed
using the standard protocol following bolus administration of
intravenous contrast.
CONTRAST:  100mL UEUQCQ-755 IOPAMIDOL (UEUQCQ-755) INJECTION 61%

[Series 3: abd/ pelvis 5.0 i30f 2 · axial · 0.92mm/px · z∈[+802,+1227]mm · 14 of 95 slices shown, 16 images]
[im 5/95  soft-tissue]
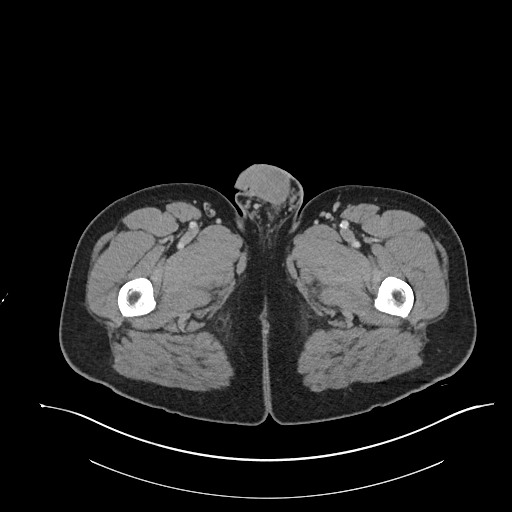
[im 5/95  bone]
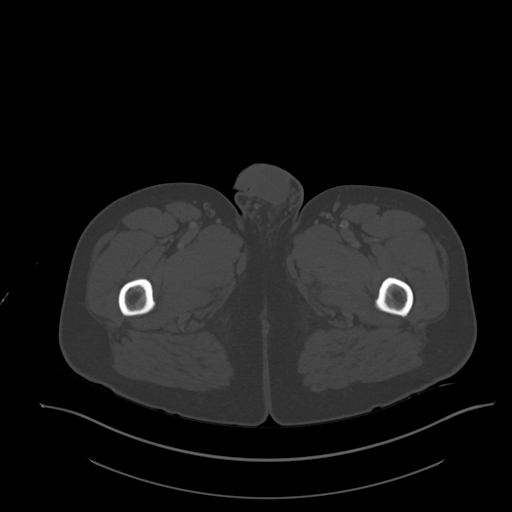
[im 10/95  soft-tissue]
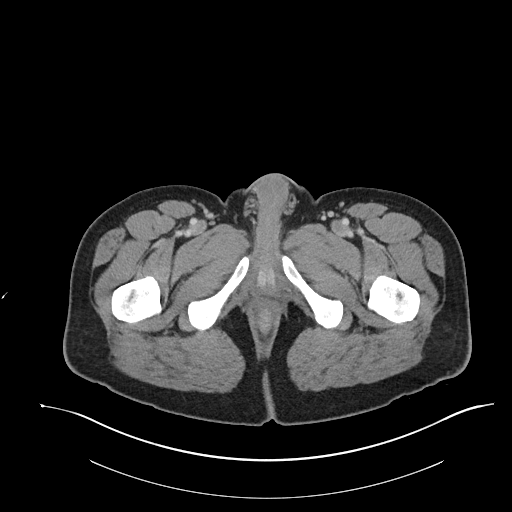
[im 20/95  soft-tissue]
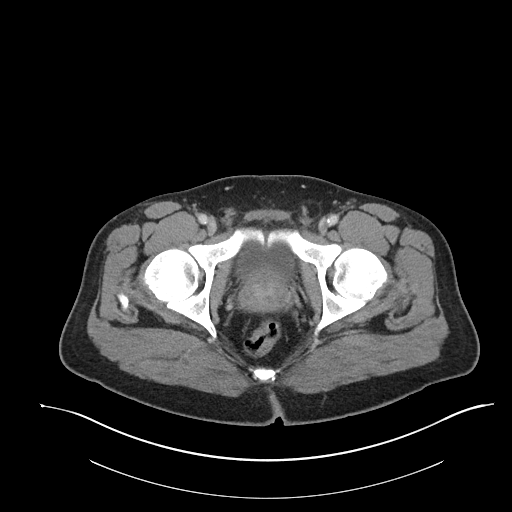
[im 25/95  soft-tissue]
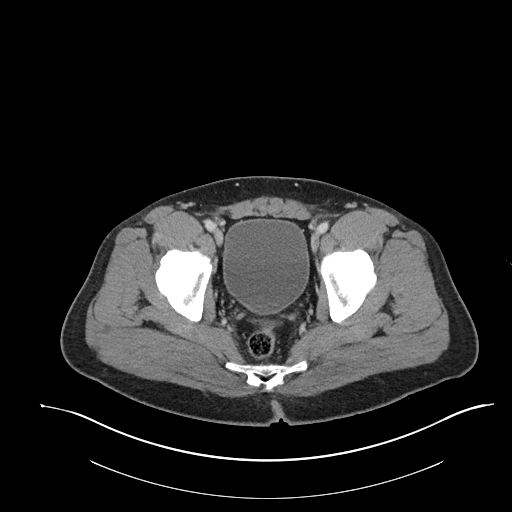
[im 30/95  soft-tissue]
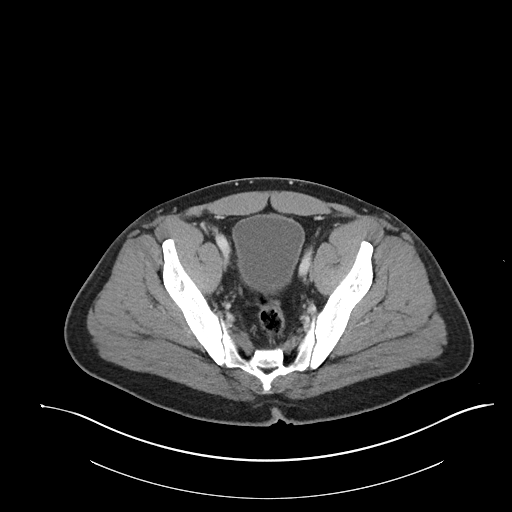
[im 40/95  soft-tissue]
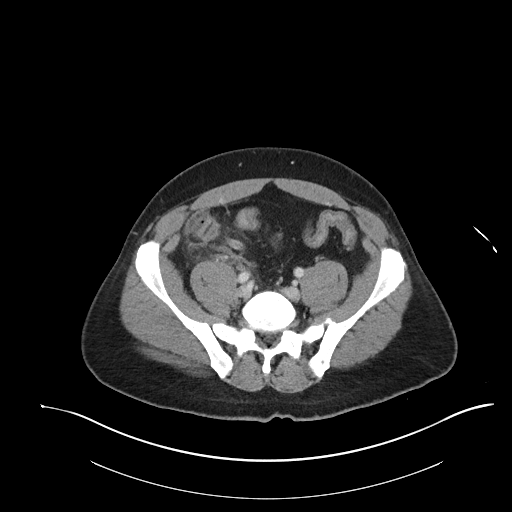
[im 45/95  soft-tissue]
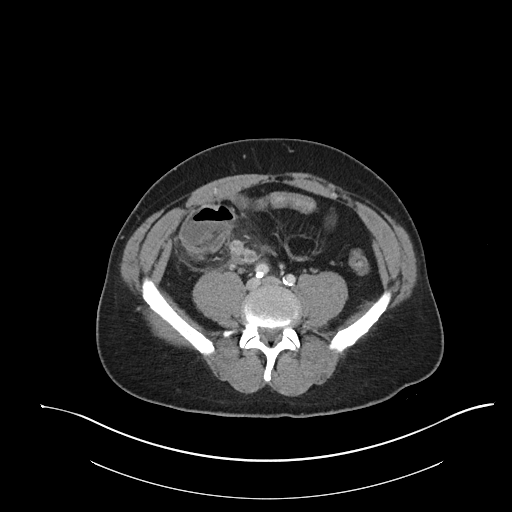
[im 50/95  soft-tissue]
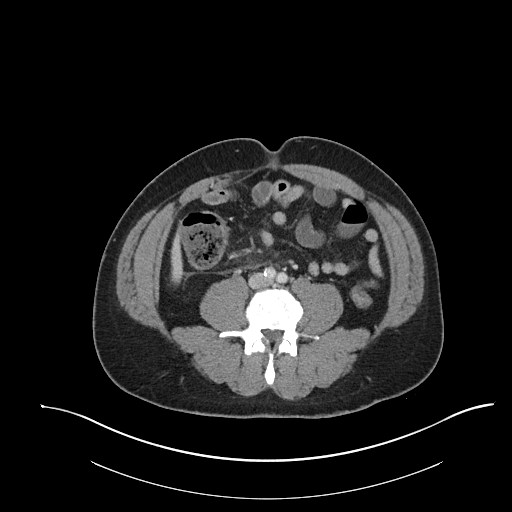
[im 55/95  soft-tissue]
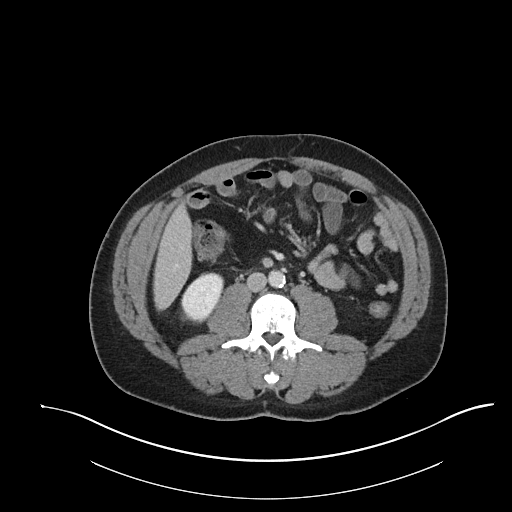
[im 55/95  bone]
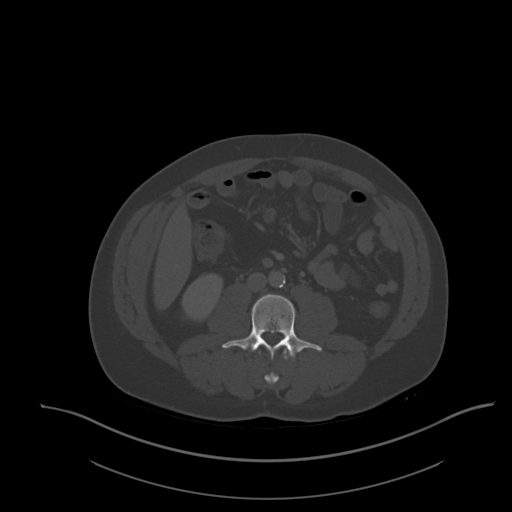
[im 65/95  soft-tissue]
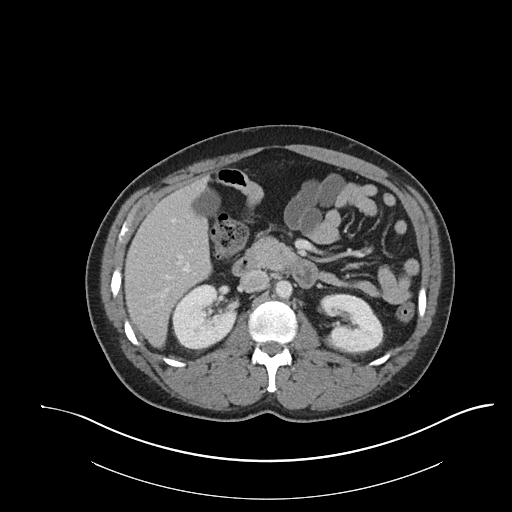
[im 70/95  soft-tissue]
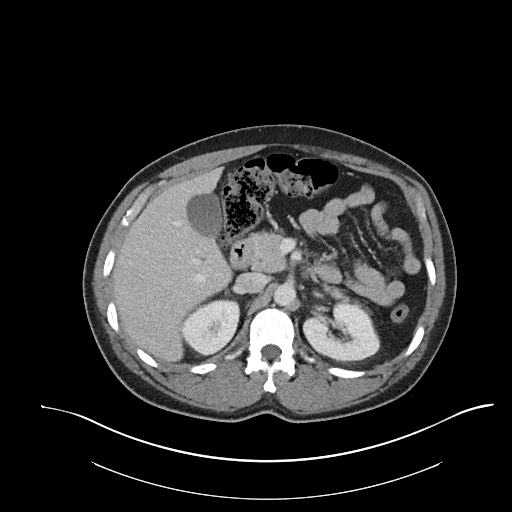
[im 75/95  soft-tissue]
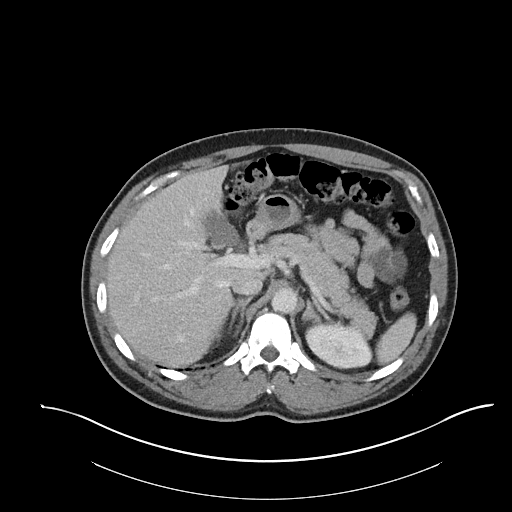
[im 85/95  soft-tissue]
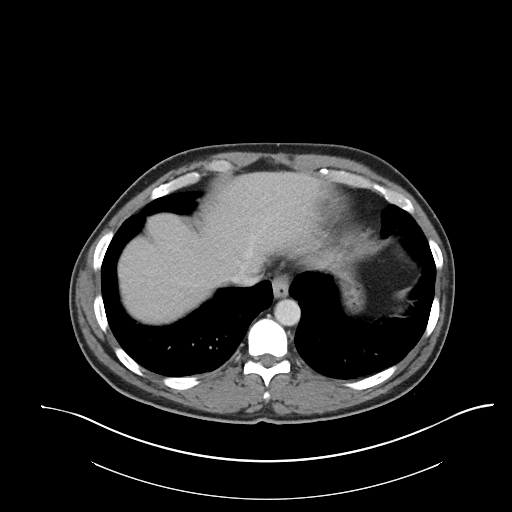
[im 90/95  soft-tissue]
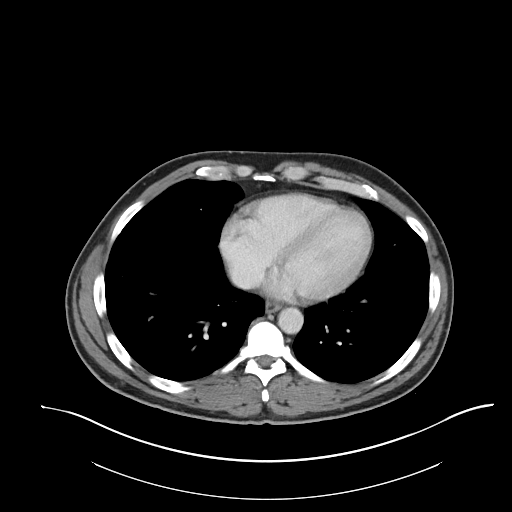

[Series 6: coronal soft tissue · coronal · 0.89mm/px · 3 of 101 slices shown]
[im 34/101  soft-tissue]
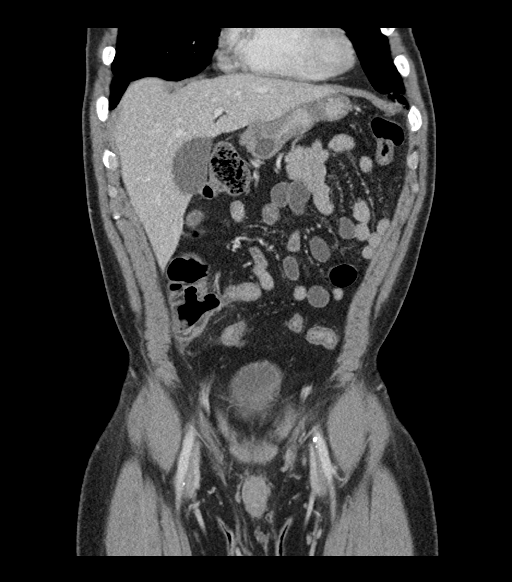
[im 45/101  soft-tissue]
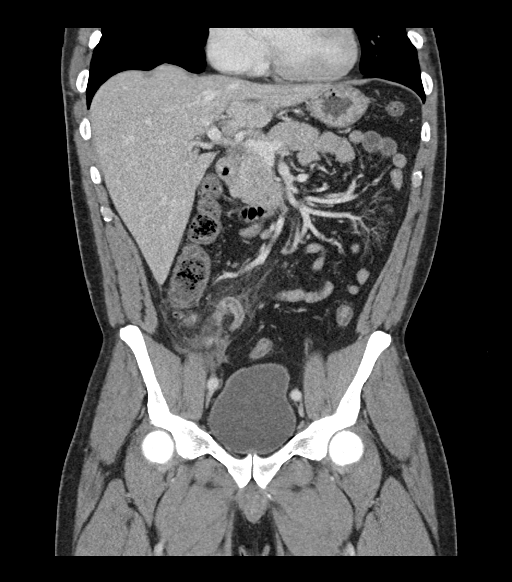
[im 56/101  soft-tissue]
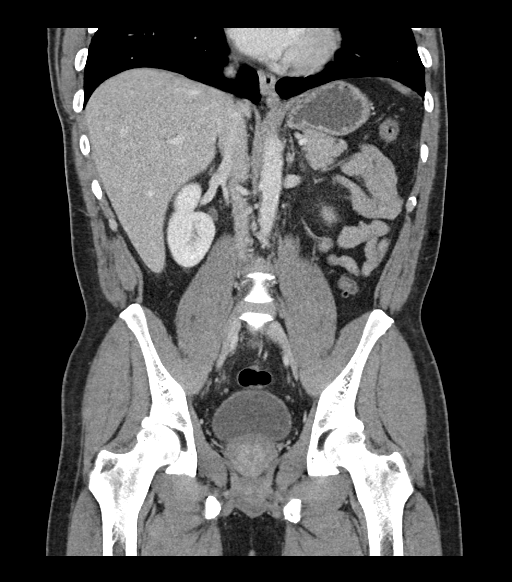

[17 of 46 positions shown; findings below may reference images not displayed]

FINDINGS: Lower Chest: No acute findings.

Hepatobiliary: No hepatic masses identified. Tiny gallstones noted,
however there is no evidence of cholecystitis or biliary ductal
dilatation.

Pancreas:  No mass or inflammatory changes.

Spleen: Within normal limits in size and appearance.

Adrenals/Urinary Tract: No masses identified. No evidence of
hydronephrosis.

Stomach/Bowel:  Findings of acute appendicitis as follows:

Appendix: Location: Standard

Diameter: 10 mm

Appendicolith: Absent

Mucosal hyper-enhancement: Present

Extraluminal Gas: Absent

Periappendiceal Collection: None

Vascular/Lymphatic: No pathologically enlarged lymph nodes. No
abdominal aortic aneurysm. Aortic atherosclerosis.

Reproductive:  No mass or other significant abnormality.

Other:  None.

Musculoskeletal:  No suspicious bone lesions identified.
IMPRESSION: Positive for acute appendicitis. No evidence of abscess or other
complication.

Cholelithiasis.  No radiographic evidence of cholecystitis.

## 2020-03-06 ENCOUNTER — Ambulatory Visit: Payer: BC Managed Care – PPO | Attending: Family

## 2020-03-06 DIAGNOSIS — Z23 Encounter for immunization: Secondary | ICD-10-CM

## 2020-03-06 NOTE — Progress Notes (Signed)
   Covid-19 Vaccination Clinic  Name:  Joseph Rowe    MRN: [id number] DOB: May 05, 1966  03/06/2020  Joseph Rowe was observed post Covid-19 immunization for 15 minutes without incident. He was provided with Vaccine Information Sheet and instruction to access the V-Safe system.   Joseph Rowe was instructed to call 911 with any severe reactions post vaccine: Marland Kitchen Difficulty breathing  . Swelling of face and throat  . A fast heartbeat  . A bad rash all over body  . Dizziness and weakness   Immunizations Administered    Name Date Dose VIS Date Route   Moderna COVID-19 Vaccine 03/06/2020 10:04 AM 0.5 mL 10/30/2019 Intramuscular   Manufacturer: Moderna   Lot: [id number]   NDC: 99692-493-24

## 2020-04-08 ENCOUNTER — Ambulatory Visit: Payer: BC Managed Care – PPO | Attending: Family

## 2020-04-08 DIAGNOSIS — Z23 Encounter for immunization: Secondary | ICD-10-CM

## 2020-04-08 NOTE — Progress Notes (Signed)
   Covid-19 Vaccination Clinic  Name:  Joseph Rowe    MRN: [id number] DOB: 1966/10/02  04/08/2020  [name] was observed post Covid-19 immunization for 15 minutes without incident. He was provided with Vaccine Information Sheet and instruction to access the V-Safe system.   [name] was instructed to call 911 with any severe reactions post vaccine: Marland Kitchen Difficulty breathing  . Swelling of face and throat  . A fast heartbeat  . A bad rash all over body  . Dizziness and weakness   Immunizations Administered    Name Date Dose VIS Date Route   Moderna COVID-19 Vaccine 04/08/2020 10:08 AM 0.5 mL 10/2019 Intramuscular   Manufacturer: Moderna   Lot: [id number]   NDC: 83338-329-19

## 2020-07-25 ENCOUNTER — Other Ambulatory Visit: Payer: Self-pay

## 2020-07-25 ENCOUNTER — Other Ambulatory Visit: Payer: BC Managed Care – PPO

## 2020-07-25 DIAGNOSIS — Z20822 Contact with and (suspected) exposure to covid-19: Secondary | ICD-10-CM

## 2020-07-27 LAB — NOVEL CORONAVIRUS, NAA: SARS-CoV-2, NAA: NOT DETECTED

## 2020-07-27 LAB — SARS-COV-2, NAA 2 DAY TAT

## 2020-10-02 ENCOUNTER — Ambulatory Visit: Payer: BC Managed Care – PPO | Attending: Family

## 2020-10-02 DIAGNOSIS — Z23 Encounter for immunization: Secondary | ICD-10-CM

## 2020-12-16 NOTE — Progress Notes (Signed)
   Covid-19 Vaccination Clinic  Name:  Joseph Rowe    MRN: [id number] DOB: 08-Dec-1965  12/16/2020  [name] was observed post Covid-19 immunization for 15 minutes without incident. He was provided with Vaccine Information Sheet and instruction to access the V-Safe system.   [name] was instructed to call 911 with any severe reactions post vaccine: Marland Kitchen Difficulty breathing  . Swelling of face and throat  . A fast heartbeat  . A bad rash all over body  . Dizziness and weakness   Immunizations Administered    Name Date Dose VIS Date Route   Moderna Covid-19 Booster Vaccine 10/02/2020 10:15 AM 0.25 mL 09/17/2020 Intramuscular   Manufacturer: Moderna   Lot: [id number]   NDC: 72620-355-97

## 2021-09-21 ENCOUNTER — Ambulatory Visit: Payer: Self-pay | Attending: Family

## 2021-09-21 DIAGNOSIS — Z23 Encounter for immunization: Secondary | ICD-10-CM

## 2021-09-24 NOTE — Progress Notes (Signed)
   Covid-19 Vaccination Clinic  Name:  CLARICE BONAVENTURE    MRN: [id number] DOB: March 22, 1966  09/24/2021  Mr. Baranek was observed post Covid-19 immunization for 15 minutes without incident. He was provided with Vaccine Information Sheet and instruction to access the V-Safe system.   Mr. Schlabach was instructed to call 911 with any severe reactions post vaccine: Difficulty breathing  Swelling of face and throat  A fast heartbeat  A bad rash all over body  Dizziness and weakness
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
## 2022-04-22 ENCOUNTER — Ambulatory Visit (INDEPENDENT_AMBULATORY_CARE_PROVIDER_SITE_OTHER): Payer: No Typology Code available for payment source | Admitting: Student

## 2022-04-22 ENCOUNTER — Encounter: Payer: Self-pay | Admitting: Student

## 2022-04-22 VITALS — BP 134/72 | HR 81 | Temp 98.2°F | Ht 69.0 in | Wt 201.4 lb

## 2022-04-22 DIAGNOSIS — J310 Chronic rhinitis: Secondary | ICD-10-CM | POA: Diagnosis not present

## 2022-04-22 DIAGNOSIS — J449 Chronic obstructive pulmonary disease, unspecified: Secondary | ICD-10-CM

## 2022-04-22 DIAGNOSIS — Z87891 Personal history of nicotine dependence: Secondary | ICD-10-CM

## 2022-04-22 DIAGNOSIS — J329 Chronic sinusitis, unspecified: Secondary | ICD-10-CM

## 2022-04-22 MED ORDER — ALBUTEROL SULFATE HFA 108 (90 BASE) MCG/ACT IN AERS: 2.0000 | INHALATION_SPRAY | Freq: Four times a day (QID) | RESPIRATORY_TRACT | 6 refills | Status: DC | PRN

## 2022-04-22 MED ORDER — FLUTICASONE PROPIONATE 50 MCG/ACT NA SUSP: 1.0000 | Freq: Every day | NASAL | 6 refills | Status: AC

## 2022-04-22 MED ORDER — BREZTRI AEROSPHERE 160-9-4.8 MCG/ACT IN AERO: 2.0000 | INHALATION_SPRAY | Freq: Two times a day (BID) | RESPIRATORY_TRACT | 0 refills | Status: DC

## 2022-04-22 MED ORDER — BREZTRI AEROSPHERE 160-9-4.8 MCG/ACT IN AERO: 2.0000 | INHALATION_SPRAY | Freq: Two times a day (BID) | RESPIRATORY_TRACT | 11 refills | Status: DC

## 2022-04-22 NOTE — Patient Instructions (Addendum)
-   try Breztri 2 puffs twice daily with spacer rinse mouth and spacer after use - albuterol inhaler as needed - after clearing your nose of crusting use flonase 2 sprays each nostril daily for 2 weeks then 1 spray each nostril daily until next visit - see you in 6 months or sooner if need be!

## 2022-04-28 ENCOUNTER — Other Ambulatory Visit (HOSPITAL_COMMUNITY): Payer: Self-pay

## 2022-05-11 ENCOUNTER — Telehealth: Payer: Self-pay | Admitting: Pharmacy Technician

## 2022-05-27 NOTE — Telephone Encounter (Signed)
Mychart message sent.

## 2022-05-31 ENCOUNTER — Other Ambulatory Visit (HOSPITAL_COMMUNITY): Payer: Self-pay

## 2022-09-10 ENCOUNTER — Ambulatory Visit (HOSPITAL_COMMUNITY)
Admission: EM | Admit: 2022-09-10 | Discharge: 2022-09-10 | Disposition: A | Discharge status: Home / Self Care | Payer: No Typology Code available for payment source | Attending: Nurse Practitioner | Admitting: Nurse Practitioner

## 2022-09-10 ENCOUNTER — Encounter (HOSPITAL_COMMUNITY): Payer: Self-pay | Admitting: *Deleted

## 2022-09-10 DIAGNOSIS — J029 Acute pharyngitis, unspecified: Secondary | ICD-10-CM

## 2022-09-10 LAB — POCT RAPID STREP A, ED / UC: Streptococcus, Group A Screen (Direct): NEGATIVE

## 2022-09-10 MED ORDER — LEVOCETIRIZINE DIHYDROCHLORIDE 5 MG PO TABS: 5.0000 mg | ORAL_TABLET | Freq: Every evening | ORAL | 0 refills | Status: AC

## 2022-09-10 NOTE — ED Triage Notes (Signed)
Pt has a sore throat x 3 months when he first woke up and by noon its better. He does have some congestion when he wakes up.

## 2022-09-10 NOTE — ED Provider Notes (Signed)
[place]    CSN: [id number] Arrival date & time: 09/10/22  1632      History   Chief Complaint Chief Complaint  Patient presents with   Sore Throat    HPI Joseph Rowe is a 56 y.o. male.   Subjective:   Joseph Rowe is a 56 y.o. male who presents for evaluation of a sore throat. He denies any fever, sweats, chills, runny nose, headache, myalgias, sinus congestion, nasal congestion, or sneezing. No tongue pain/swelling, hoarseness, weight loss, ear pain or problems swallowing. Onset of symptoms was 3 months ago and has been unchanged since that time. Patient reports that he typically wakes up every morning with a sore throat. He is able to cough up phlegm as well. Within several hours of being awake, the sore throat resolves without any intervention. He is drinking plenty of fluids. He has not had recent close exposure to someone with proven streptococcal pharyngitis. He is unsure if he snores at night. He denies history of sleep apnea. He is a former smoker and is concerned for possible throat cancer.   The following portions of the patient's history were reviewed and updated as appropriate: allergies, current medications, past family history, past medical history, past social history, past surgical history, and problem list.        History reviewed. No pertinent past medical history.  Patient Active Problem List   Diagnosis Date Noted   Acute appendicitis with localized peritonitis 01/21/2018    Past Surgical History:  Procedure Laterality Date   LAPAROSCOPIC APPENDECTOMY N/A 01/21/2018   Procedure: APPENDECTOMY LAPAROSCOPIC;  Surgeon: Donnie Mesa, MD;  Location: [facility];  Service: General;  Laterality: N/A;       Home Medications    Prior to Admission medications   Medication Sig Start Date End Date Taking? Authorizing Provider  amLODipine (NORVASC) 5 MG tablet Take 5 mg by mouth daily.   Yes [provider]  Aspirin-Caffeine (BC  FAST PAIN RELIEF PO) Take 1 packet by mouth daily as needed (pain).   Yes [provider]  diphenhydrAMINE (BENADRYL) 25 mg capsule Take 1 capsule by mouth at bedtime as needed. 03/18/22  Yes [provider]  fluticasone (FLONASE) 50 MCG/ACT nasal spray Place 1 spray into both nostrils daily. 04/22/22  Yes Maryjane Hurter, MD  levocetirizine (XYZAL) 5 MG tablet Take 1 tablet (5 mg total) by mouth every evening. 09/10/22 10/10/22 Yes Enrique Sack, FNP  omeprazole (PRILOSEC OTC) 20 MG tablet As needed   Yes [provider]    Family History Family History  Problem Relation Age of Onset   Hypertension Mother    Hypertension Father     Social History Social History   Tobacco Use   Smoking status: Former    Packs/day: 2.00    Years: 33.00    Total pack years: 66.00    Types: Cigarettes    Quit date: 11/30/2019    Years since quitting: 2.7   Smokeless tobacco: Never  Vaping Use   Vaping Use: Never used  Substance Use Topics   Alcohol use: Yes    Alcohol/week: 35.0 standard drinks of alcohol    Types: 35 Shots of liquor per week   Drug use: No     Allergies   Patient has no known allergies.   Review of Systems Review of Systems  Constitutional:  Negative for fatigue, fever and unexpected weight change.  HENT:  Positive for sore throat. Negative for congestion, ear pain,  postnasal drip, rhinorrhea, sinus pressure, sinus pain, sneezing, trouble swallowing and voice change.   Respiratory:  Negative for cough and shortness of breath.   Gastrointestinal:  Negative for nausea and vomiting.  Musculoskeletal:  Negative for myalgias.  Neurological:  Negative for headaches.  All other systems reviewed and are negative.    Physical Exam Triage Vital Signs ED Triage Vitals  Enc Vitals Group     BP 09/10/22 1733 (!) 121/93     Pulse Rate 09/10/22 1733 81     Resp 09/10/22 1733 18     Temp 09/10/22 1733 98.9 F (37.2 C)     Temp Source 09/10/22  1733 Oral     SpO2 09/10/22 1733 95 %     Weight --      Height --      Head Circumference --      Peak Flow --      Pain Score 09/10/22 1730 2     Pain Loc --      Pain Edu? --      Excl. in [place]? --    No data found.  Updated Vital Signs BP (!) 121/93 (BP Location: Right Arm)   Pulse 81   Temp 98.9 F (37.2 C) (Oral)   Resp 18   SpO2 95%   Visual Acuity Right Eye Distance:   Left Eye Distance:   Bilateral Distance:    Right Eye Near:   Left Eye Near:    Bilateral Near:     Physical Exam Vitals reviewed.  Constitutional:      General: He is not in acute distress.    Appearance: He is well-developed and normal weight. He is not ill-appearing.  HENT:     Head: Normocephalic.     Right Ear: Tympanic membrane and ear canal normal.     Left Ear: Tympanic membrane and ear canal normal.     Nose: No congestion.     Mouth/Throat:     Mouth: Mucous membranes are moist. No oral lesions.     Pharynx: Oropharynx is clear. No pharyngeal swelling, oropharyngeal exudate, posterior oropharyngeal erythema or uvula swelling.     Tonsils: No tonsillar exudate or tonsillar abscesses.  Eyes:     Conjunctiva/sclera: Conjunctivae normal.     Pupils: Pupils are equal, round, and reactive to light.  Cardiovascular:     Rate and Rhythm: Normal rate.     Heart sounds: Normal heart sounds.  Pulmonary:     Effort: Pulmonary effort is normal.     Breath sounds: Normal breath sounds.  Abdominal:     General: Bowel sounds are normal.     Palpations: Abdomen is soft.  Musculoskeletal:     Cervical back: Normal range of motion and neck supple.  Lymphadenopathy:     Cervical: No cervical adenopathy.  Skin:    General: Skin is warm and dry.  Neurological:     General: No focal deficit present.     Mental Status: He is alert and oriented to person, place, and time.  Psychiatric:        Mood and Affect: Mood normal.        Behavior: Behavior normal.      UC Treatments / Results   Labs (all labs ordered are listed, but only abnormal results are displayed) Labs Reviewed  POCT RAPID STREP A, ED / UC    EKG   Radiology No results found.  Procedures Procedures (including critical care time)  Medications Ordered in  UC Medications - No data to display  Initial Impression / Assessment and Plan / UC Course  I have reviewed the triage vital signs and the nursing notes.  Pertinent labs & imaging results that were available during my care of the patient were reviewed by me and considered in my medical decision making (see chart for details).    56 year old male presents with a three-month history of sore throat that only occurs in the morning upon awaking. He is able to cough up some phlegm as well. The sore throat typically resolves several hours later without any specific intervention. He denies any fever, sweats, chills, runny nose, headache, myalgias, sinus congestion, nasal congestion, sneezing, tongue pain/swelling, hoarseness, weight loss, ear pain or problems swallowing. He is concerned for possible throat cancer as he has a history of smoking. He has quit for several years now. Physical exam unremarkable. No abnormalities noted within the oropharyngeal region. No cervical lymphadenopathy. Rapid strep negative. Suspect postnasal drainage as the etiology of his symptoms as he does not have any other significant concerns. Advised levocetrizine QHS and continued use of Flonase every morning. Discussed the symptoms of throat cancer and advise that he follow up with his primary care provider at the [place]. Patient reports that an appointment isn't available for two months. Recommended that he make an appointment for their first available time and to monitor for the development of any concerning symptoms that would warrant him to be seen sooner. Patient verbalized understanding and agreeable.    Today's evaluation has revealed no signs of a dangerous process. Discussed  diagnosis with patient and/or guardian. Patient and/or guardian aware of their diagnosis, possible red flag symptoms to watch out for and need for close follow up. Patient and/or guardian understands verbal and written discharge instructions. Patient and/or guardian comfortable with plan and disposition.  Patient and/or guardian has a clear mental status at this time, good insight into illness (after discussion and teaching) and has clear judgment to make decisions regarding their care  Documentation was completed with the aid of voice recognition software. Transcription may contain typographical errors. Final Clinical Impressions(s) / UC Diagnoses   Final diagnoses:  Sore throat     Discharge Instructions      The sore throat that you experience in the mornings could be from allergies, snoring at night, sleeping with mouth open or even acid reflux. Your strep test was negative so it is less likely that it is due to infection.   Take medications as prescribed.  Make sure to continue using your nasal spray in the morning.   I understand that you are concerned for possible cancer given your history of smoking. [facility]   Follow-up immediately if:  You have difficulty breathing. You cannot swallow fluids, soft foods, or your saliva. You develop a lump, growth, or sore in the throat that does not get better. You start having trouble swallowing. You develop ear pain. You start to lose weight without trying (unintentional weight loss). You develop hoarse voice that does not go away.     ED Prescriptions     Medication Sig Dispense Auth. Provider   levocetirizine (XYZAL) 5 MG tablet Take 1 tablet (5 mg total) by mouth every evening. 30 tablet Enrique Sack, FNP      PDMP not reviewed this encounter.   Enrique Sack, [place] 09/10/22 1839

## 2022-09-10 NOTE — Discharge Instructions (Addendum)
The sore throat that you experience in the mornings could be from allergies, snoring at night, sleeping with mouth open or even acid reflux. Your strep test was negative so it is less likely that it is due to infection.   Take medications as prescribed.  Make sure to continue using your nasal spray in the morning.   I understand that you are concerned for possible cancer given your history of smoking. [facility] as [facility] can [facility].   Follow-up immediately if:  You have difficulty breathing. You cannot swallow fluids, soft foods, or your saliva. You develop a lump, growth, or sore in the throat that does not get better. You start having trouble swallowing. You develop ear pain. You start to lose weight without trying (unintentional weight loss). You develop hoarse voice that does not go away.

## 2022-10-12 NOTE — Progress Notes (Unsigned)
Synopsis: Referred for COPD by Georgann Housekeeper, MD  Subjective:   PATIENT ID: Joseph Rowe GENDER: male DOB: 11-Aug-1966, MRN: [id number]  No chief complaint on file.  55yM with history of smoking - 40+py quit in 2021 not yet enrolled in lung cancer screening, HTN, alcohol use referred for suspected COPD  Occasionally gets winded even at rest. He hasn't noticed dyspnea worse than peers during strenuous exertion. He has some cough in the morning with some phlegm but has some trouble expectorating it until he mixes ginger powder with water to help cough it out.   Intermittent sinus congestion.   At end of visit he says he actually had PFTs done last week at [facility]   He has no family history of lung disease or lung cancer  He works as an Personnel officer. No MJ/vaping. He does drink heavily mostly on weekend. He has lived in [place] and then was in [employer]- stationed overseas in [place] and then a few trips to [place]. Some burn pit exposure for about 6 months in [place].    Interval HPI: Started on breztri last visit. [facility] To [facility]/13 for sore throat.  Otherwise pertinent review of systems is negative. No past medical history on file.   Family History  Problem Relation Age of Onset   Hypertension Mother    Hypertension Father      Past Surgical History:  Procedure Laterality Date   LAPAROSCOPIC APPENDECTOMY N/A 01/21/2018   Procedure: APPENDECTOMY LAPAROSCOPIC;  Surgeon: Manus Rudd, MD;  Location: [facility];  Service: General;  Laterality: N/A;    Social History   Socioeconomic History   Marital status: Divorced    Spouse name: Not on file   Number of children: Not on file   Years of education: Not on file   Highest education level: Not on file  Occupational History   Not on file  Tobacco Use   Smoking status: Former    Packs/day: 2.00    Years: 33.00    Total pack years: 66.00    Types: Cigarettes    Quit date:  11/30/2019    Years since quitting: 2.8   Smokeless tobacco: Never  Vaping Use   Vaping Use: Never used  Substance and Sexual Activity   Alcohol use: Yes    Alcohol/week: 35.0 standard drinks of alcohol    Types: 35 Shots of liquor per week   Drug use: No   Sexual activity: Yes  Other Topics Concern   Not on file  Social History Narrative   Not on file   Social Determinants of Health   Financial Resource Strain: Not on file  Food Insecurity: Not on file  Transportation Needs: Not on file  Physical Activity: Not on file  Stress: Not on file  Social Connections: Not on file  Intimate Partner Violence: Not on file     No Known Allergies   Outpatient Medications Prior to Visit  Medication Sig Dispense Refill   amLODipine (NORVASC) 5 MG tablet Take 5 mg by mouth daily.     Aspirin-Caffeine (BC FAST PAIN RELIEF PO) Take 1 packet by mouth daily as needed (pain).     diphenhydrAMINE (BENADRYL) 25 mg capsule Take 1 capsule by mouth at bedtime as needed.     fluticasone (FLONASE) 50 MCG/ACT nasal spray Place 1 spray into both nostrils daily. 16 g 6   levocetirizine (XYZAL) 5 MG tablet Take 1 tablet (5 mg total) by mouth every evening.  30 tablet 0   omeprazole (PRILOSEC OTC) 20 MG tablet As needed     No facility-administered medications prior to visit.       Objective:   Physical Exam:  General appearance: 56 y.o., male, NAD, conversant  Eyes: anicteric sclerae; PERRL, tracking appropriately HENT: NCAT; MMM. No nasal polyps Neck: Trachea midline; no lymphadenopathy, no JVD Lungs: CTAB, no crackles, no wheeze, with normal respiratory effort CV: RRR, no murmur  Abdomen: Soft, non-tender; non-distended, BS present  Extremities: No peripheral edema, warm Skin: Normal turgor and texture; no rash Psych: Appropriate affect Neuro: Alert and oriented to person and place, no focal deficit     There were no vitals filed for this visit.    on RA BMI Readings from Last 3  Encounters:  04/22/22 29.74 kg/m  01/21/18 26.57 kg/m   Wt Readings from Last 3 Encounters:  04/22/22 201 lb 6.4 oz (91.4 kg)  01/21/18 179 lb 14.3 oz (81.6 kg)     CBC    Component Value Date/Time   WBC 15.3 (H) 01/22/2018 0446   RBC 4.14 (L) 01/22/2018 0446   HGB 13.5 01/22/2018 0446   HCT 42.0 01/22/2018 0446   PLT 240 01/22/2018 0446   MCV 101.4 (H) 01/22/2018 0446   MCH 32.6 01/22/2018 0446   MCHC 32.1 01/22/2018 0446   RDW 12.0 01/22/2018 0446   LYMPHSABS 1.2 04/24/2008 1805   MONOABS 0.6 04/24/2008 1805   EOSABS 0.0 04/24/2008 1805   BASOSABS 0.0 04/24/2008 1805    Chest Imaging: CT A/P lung bases 2019 reviewed by me unremarkable  CT Sinuses OSH with bilateral maxillary sinus disease, mucus retention cyst  Pulmonary Functions Testing Results:     No data to display         OSH PFT reportedly with 'severe obstruction with significant bronchodilator response'    Assessment & Plan:   # COPD Significant smoking history and 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 Not exacerbator.   # Chronic rhinosinusitis  # history of smoking 40+py quit 2021.  Plan: - trial breztri 2 puffs twice daily with spacer, instructed regarding technique - after clearing your nose of crusting use flonase 2 sprays each nostril daily for 2 weeks then 1 spray each nostril daily until next visit - albuterol prn - [facility] - declines to enroll now in lung cancer screening but we will discuss at future visit  Return to clinic 6 months or sooner as needed  Omar Person, MD [facility] 10/12/2022 4:16 PM

## 2022-10-13 ENCOUNTER — Ambulatory Visit (INDEPENDENT_AMBULATORY_CARE_PROVIDER_SITE_OTHER): Payer: No Typology Code available for payment source | Admitting: Student

## 2022-10-13 ENCOUNTER — Encounter: Payer: Self-pay | Admitting: Student

## 2022-10-13 VITALS — BP 136/90 | HR 70 | Ht 69.0 in | Wt 205.8 lb

## 2022-10-13 DIAGNOSIS — R070 Pain in throat: Secondary | ICD-10-CM | POA: Diagnosis not present

## 2022-10-13 NOTE — Patient Instructions (Signed)
-   If bothersome cough, trouble breathing would recommend having PCP at [facility] or our practice prescribe wixela and spiriva inhalers - Referral made today to ENT for chronic throat pain - Check out [email or web address] to review your unique risks and benefits from participating in lung cancer screening - this is something that you can do at the [place] or through our practice.
